# Patient Record
Sex: Female | Born: 1960 | Race: Black or African American | Hispanic: No | Smoking: Former smoker
Health system: Southern US, Community
[De-identification: ages and names within clinical notes are randomized; demographics above are authoritative.]

## PROBLEM LIST (undated history)

## (undated) DIAGNOSIS — I10 Essential (primary) hypertension: Secondary | ICD-10-CM

## (undated) DIAGNOSIS — K219 Gastro-esophageal reflux disease without esophagitis: Secondary | ICD-10-CM

## (undated) DIAGNOSIS — G4700 Insomnia, unspecified: Secondary | ICD-10-CM

## (undated) DIAGNOSIS — C73 Malignant neoplasm of thyroid gland: Secondary | ICD-10-CM

## (undated) DIAGNOSIS — J45909 Unspecified asthma, uncomplicated: Secondary | ICD-10-CM

## (undated) DIAGNOSIS — F419 Anxiety disorder, unspecified: Secondary | ICD-10-CM

## (undated) DIAGNOSIS — M1990 Unspecified osteoarthritis, unspecified site: Secondary | ICD-10-CM

## (undated) DIAGNOSIS — T7840XA Allergy, unspecified, initial encounter: Secondary | ICD-10-CM

## (undated) DIAGNOSIS — F32A Depression, unspecified: Secondary | ICD-10-CM

## (undated) DIAGNOSIS — M222X1 Patellofemoral disorders, right knee: Secondary | ICD-10-CM

## (undated) DIAGNOSIS — E559 Vitamin D deficiency, unspecified: Secondary | ICD-10-CM

## (undated) DIAGNOSIS — E039 Hypothyroidism, unspecified: Secondary | ICD-10-CM

## (undated) DIAGNOSIS — F329 Major depressive disorder, single episode, unspecified: Secondary | ICD-10-CM

## (undated) HISTORY — DX: Patellofemoral disorders, right knee: M22.2X1

## (undated) HISTORY — DX: Essential (primary) hypertension: I10

## (undated) HISTORY — DX: Unspecified osteoarthritis, unspecified site: M19.90

## (undated) HISTORY — DX: Malignant neoplasm of thyroid gland: C73

## (undated) HISTORY — DX: Hypothyroidism, unspecified: E03.9

## (undated) HISTORY — DX: Anxiety disorder, unspecified: F41.9

## (undated) HISTORY — DX: Depression, unspecified: F32.A

## (undated) HISTORY — DX: Major depressive disorder, single episode, unspecified: F32.9

## (undated) HISTORY — DX: Vitamin D deficiency, unspecified: E55.9

## (undated) HISTORY — DX: Allergy, unspecified, initial encounter: T78.40XA

## (undated) HISTORY — DX: Insomnia, unspecified: G47.00

## (undated) HISTORY — DX: Gastro-esophageal reflux disease without esophagitis: K21.9

## (undated) HISTORY — PX: ABDOMINAL HYSTERECTOMY: SHX81

---

## 1999-02-26 ENCOUNTER — Other Ambulatory Visit: Admission: RE | Admit: 1999-02-26 | Discharge: 1999-02-26 | Payer: Self-pay | Admitting: Obstetrics and Gynecology

## 1999-07-23 ENCOUNTER — Encounter: Payer: Self-pay | Admitting: Emergency Medicine

## 1999-07-23 ENCOUNTER — Emergency Department (HOSPITAL_COMMUNITY): Admission: EM | Admit: 1999-07-23 | Discharge: 1999-07-23 | Payer: Self-pay | Admitting: Emergency Medicine

## 1999-09-11 ENCOUNTER — Emergency Department (HOSPITAL_COMMUNITY): Admission: EM | Admit: 1999-09-11 | Discharge: 1999-09-11 | Payer: Self-pay | Admitting: Emergency Medicine

## 2000-03-03 ENCOUNTER — Other Ambulatory Visit: Admission: RE | Admit: 2000-03-03 | Discharge: 2000-03-03 | Payer: Self-pay | Admitting: *Deleted

## 2001-03-05 ENCOUNTER — Other Ambulatory Visit: Admission: RE | Admit: 2001-03-05 | Discharge: 2001-03-05 | Payer: Self-pay | Admitting: *Deleted

## 2002-09-10 ENCOUNTER — Other Ambulatory Visit: Admission: RE | Admit: 2002-09-10 | Discharge: 2002-09-10 | Payer: Self-pay | Admitting: Obstetrics and Gynecology

## 2002-12-04 ENCOUNTER — Encounter (INDEPENDENT_AMBULATORY_CARE_PROVIDER_SITE_OTHER): Payer: Self-pay

## 2002-12-04 ENCOUNTER — Ambulatory Visit (HOSPITAL_COMMUNITY): Admission: RE | Admit: 2002-12-04 | Discharge: 2002-12-04 | Payer: Self-pay | Admitting: Obstetrics and Gynecology

## 2002-12-15 ENCOUNTER — Encounter: Payer: Self-pay | Admitting: Emergency Medicine

## 2002-12-15 ENCOUNTER — Emergency Department (HOSPITAL_COMMUNITY): Admission: EM | Admit: 2002-12-15 | Discharge: 2002-12-15 | Payer: Self-pay | Admitting: Emergency Medicine

## 2003-01-18 ENCOUNTER — Emergency Department (HOSPITAL_COMMUNITY): Admission: EM | Admit: 2003-01-18 | Discharge: 2003-01-18 | Payer: Self-pay | Admitting: Emergency Medicine

## 2005-08-23 ENCOUNTER — Ambulatory Visit (HOSPITAL_COMMUNITY): Admission: RE | Admit: 2005-08-23 | Discharge: 2005-08-23 | Payer: Self-pay | Admitting: Obstetrics and Gynecology

## 2005-09-19 ENCOUNTER — Ambulatory Visit (HOSPITAL_COMMUNITY): Admission: RE | Admit: 2005-09-19 | Discharge: 2005-09-19 | Payer: Self-pay | Admitting: Obstetrics and Gynecology

## 2007-06-17 ENCOUNTER — Emergency Department (HOSPITAL_COMMUNITY): Admission: EM | Admit: 2007-06-17 | Discharge: 2007-06-17 | Payer: Self-pay | Admitting: Emergency Medicine

## 2008-03-11 ENCOUNTER — Ambulatory Visit (HOSPITAL_COMMUNITY): Admission: RE | Admit: 2008-03-11 | Discharge: 2008-03-11 | Payer: Self-pay | Admitting: Internal Medicine

## 2009-09-08 ENCOUNTER — Encounter (INDEPENDENT_AMBULATORY_CARE_PROVIDER_SITE_OTHER): Payer: Self-pay | Admitting: *Deleted

## 2009-09-14 ENCOUNTER — Ambulatory Visit (HOSPITAL_COMMUNITY): Admission: RE | Admit: 2009-09-14 | Discharge: 2009-09-14 | Payer: Self-pay | Admitting: Internal Medicine

## 2009-09-22 ENCOUNTER — Encounter: Payer: Self-pay | Admitting: Gastroenterology

## 2009-10-13 ENCOUNTER — Encounter (INDEPENDENT_AMBULATORY_CARE_PROVIDER_SITE_OTHER): Payer: Self-pay | Admitting: Obstetrics and Gynecology

## 2009-10-13 ENCOUNTER — Inpatient Hospital Stay (HOSPITAL_COMMUNITY): Admission: RE | Admit: 2009-10-13 | Discharge: 2009-10-15 | Payer: Self-pay | Admitting: Obstetrics and Gynecology

## 2009-11-12 ENCOUNTER — Encounter: Admission: RE | Admit: 2009-11-12 | Discharge: 2009-11-12 | Payer: Self-pay | Admitting: Family Medicine

## 2010-01-02 ENCOUNTER — Emergency Department (HOSPITAL_COMMUNITY): Admission: EM | Admit: 2010-01-02 | Discharge: 2010-01-02 | Payer: Self-pay | Admitting: Emergency Medicine

## 2010-02-15 ENCOUNTER — Encounter: Admission: RE | Admit: 2010-02-15 | Discharge: 2010-02-15 | Payer: Self-pay | Admitting: Family Medicine

## 2010-09-05 HISTORY — PX: THYROIDECTOMY: SHX17

## 2010-09-22 LAB — BASIC METABOLIC PANEL
BUN: 7 mg/dL (ref 6–23)
CO2: 26 mEq/L (ref 19–32)
Calcium: 9.5 mg/dL (ref 8.4–10.5)
Chloride: 103 mEq/L (ref 96–112)
Creatinine, Ser: 0.92 mg/dL (ref 0.4–1.2)
GFR calc Af Amer: >60 mL/min (ref >60)
GFR calc non Af Amer: >60 mL/min (ref >60)
Glucose, Bld: 96 mg/dL (ref 70–99)
Potassium: 3.6 mEq/L (ref 3.5–5.1)
Sodium: 139 mEq/L (ref 135–145)

## 2010-09-22 LAB — CBC
HCT: 39.3 % (ref 36.0–46.0)
Hemoglobin: 13.1 g/dL (ref 12.0–15.0)
MCH: 28.1 pg (ref 26.0–34.0)
MCHC: 33.3 g/dL (ref 30.0–36.0)
MCV: 84.2 fL (ref 78.0–100.0)
Platelets: 277 10*3/uL (ref 150–400)
RBC: 4.67 MIL/uL (ref 3.87–5.11)
RDW: 13.3 % (ref 11.5–15.5)
WBC: 3.9 10*3/uL — ABNORMAL LOW (ref 4.0–10.5)

## 2010-09-22 LAB — SURGICAL PCR SCREEN
MRSA, PCR: NEGATIVE
Staphylococcus aureus: NEGATIVE

## 2010-09-23 ENCOUNTER — Encounter (INDEPENDENT_AMBULATORY_CARE_PROVIDER_SITE_OTHER): Payer: Self-pay | Admitting: Surgery

## 2010-09-23 ENCOUNTER — Ambulatory Visit (HOSPITAL_COMMUNITY)
Admission: RE | Admit: 2010-09-23 | Discharge: 2010-09-24 | Payer: Self-pay | Source: Home / Self Care | Attending: Surgery | Admitting: Surgery

## 2010-10-04 NOTE — Op Note (Signed)
[name], Ferguson                ACCOUNT NO.:  [id number]  MEDICAL RECORD NO.:  [id number]          PATIENT TYPE:  OIB  LOCATION:  5016                         FACILITY:  [facility]  PHYSICIAN:  Ardeth Sportsman, MD     DATE OF BIRTH:  12/20/1960  DATE OF PROCEDURE:  09/23/2010 DATE OF DISCHARGE:                              OPERATIVE REPORT   PRIMARY CARE PHYSICIAN:  Dr. Clarene Reamer, [facility].  OPERATING SURGEON:  Ardeth Sportsman, MD  ASSISTANT:  Juanetta Gosling, MD  PREOPERATIVE DIAGNOSES:  Thyroid nodule and right isthmus, question of follicular neoplasm.  POSTOPERATIVE DIAGNOSES:  Thyroid nodule and right isthmus, question of follicular neoplasm.  PROCEDURE PERFORMED:  Right thyroid lobectomy and isthmusectomy with medial left thyroid lobectomy.  She had central compartment neck lymph node dissection.  ANESTHESIA: 1. General anesthesia. 2. Local anesthetic and a field block around the incision.  SPECIMENS:  Right thyroid gland with isthmus and medial cuff of left thyroid lobe, there are stitches in the right superior pole.  DRAINS:  None.  ESTIMATED BLOOD LOSS:  Less than 20 mL.  COMPLICATIONS:  None apparent.  INDICATIONS:  Ms. Kimberly Ferguson is a 50 year old female with a family history of thyroid problems including thyroid mass and 2 aunts that she recalls been told she had thyroid cancer.  She felt fullness in her throat about a year ago and it has persisted and felt a mass that seemed to increase.  She had an ultrasound that showed a mass in the junction between the isthmus and right thyroid lobe.  Biopsy was suspicious for follicular neoplasm.  The anatomy and physiology of thyroid and parathyroid gland was discussed.  Differential diagnosis discussed.  Options discussed. Recommendation was made for lobectomy with isthmusectomy.  Possibility of completion thyroidectomy needed later was discussed.  Risks of  nerve injury, bleeding, re-operation, difficulty swallowing, hoarseness, and other risks were discussed.  Questions were answered and she agreed to proceed.  OPERATIVE FINDINGS:  She had a smooth, firm cervical nodule in the right isthmus.  The main thyroid lobes were smooth and not particularly abnormal.  She did not have any obvious lymphadenopathy.  DESCRIPTION OF PROCEDURE:  Informed consent was confirmed.  The patient underwent general anesthesia without any difficulty.  She received IV clindamycin and prior to surgery.  She had sequential compression devices active during the entire case.  She was positioned in a beach- chair positioning with her arms tucked and her chest mildly elevated. She had very mild extension to her neck under anesthesia guidance.  Her neck and chest were prepped and draped in a sterile fashion.  Surgical time-out confirmed our plan.  I made a 5-cm transverse curvilinear incision within the natural neck skin folds just about a finger breadth below the thyroid cartilage.  I went through subcutaneous tissues and the platysma.  It was over a centimeter thick, so I extended the incision to about 7 cm total.  I was able to split the platysma transversely.  I used blunt and cautery dissection to create subplatysmal planes down towards the manubrium inferiorly and  above the thyroid cartilage superiorly.  I placed an ulnar retractor.  I did encounter a small bleeding on one of the anterior jugular veins was easily able to control with ultrasonic dissection with the harmonic scalpel.  I split the strap muscles vertically to the midline and I found the isthmus.  I could easily feel the nodule.  It was firm, but smooth, small, and not fixed to any contagious structures.  I proceeded, could not feel and palpate any other obvious major abnormalities in her thyroid lobe.  I proceeded with right thyroid lobectomy since the mass seemed to be at the junction between the  isthmus and the right thyroid gland.  We reflected the strap muscles off the thyroid gland primarily bluntly as well as using some focus cautery laterally until we came posteriorly and then we transitioned over to clips and harmonic and avoided any cauterization on the lateral and posterior walls of the thyroid.  I mobilized until I found the middle of thyroid vein, elevated, isolated, and then ligated with clips on the neck side and ultrasonic dissection on the thyroid side.  We actually dissected over and easily isolated the superior thyroid lobe.  I carefully freed off the vessels to the superior thyroid lobe.  A strong candidate for the  superior parathyroid gland was isolated and preserved and peeled off the thyroid gland and left intact.  With that I came around the apex posteriorly.  I freed the medial and superior pole off the thyroid cartilage as well being carefully meticulous.  However, at the ligament of Allyson Sabal it was still rather firm.  Therefore, I transitioned over to the right inferior thyroid pole. Actually I freed off the inferior thyroid vessels and a good candidate for the inferior parathyroid gland and freed them off as well.  With that I got excellent lobulization localization of the  inferior gland and the medial gland until I came just around the ligament of Berry.  I could see the laryngeal nerve coming in transversely and then we stayed away from that.  It gradually freed off from thyroid gland off its attachments to the ligament of Berry and thyroid cartilage between clips and harmonic scalpel.  Eventually with freeing that off,  I could easily free it off the trachea.  I transitioned once I came to the anterior midline trachea, I switched over to cautery to free off the isthmus completely off and then bluntly free off the medial left thyroid lobe off as well.  Now I could put retraction and again palpate the left thyroid lobe and felt normal.  I did focus  on central lymph node dissection and free it off numerous fat pads and lymph nodes from above the thyroid gland down to the manubrium.  I freed that off primarily using ultrasonic dissection as well as some focus cautery.  Given the fact that the mass was in the right medial isthmus, I decided to take a cuff of left thyroid lobe as well to make sure I had at least 1 cm margin around the mass.  Therefore, I used a Kelly clamp and clamped on the mid left thyroid gland well away from the major structures laterally and posteriorly.  I took a healthy cuff of medial left thyroid lobe with that.  I suture ligated the exposed thyroid stump with 2-0 silk stitch.  I marked the specimen.  I ensured hemostasis.  There were 1 or 2 tiny venous oozing vessels along the tracheoesophageal groove.  I was  able to isolate those away and those were controlled with clips.  I then did copious pressure for about 5 minutes.  Hemostasis was excellent.  I did copious irrigation and hemostasis was good.  I placed 1 x 1 cm stamps of Surgicel in the wound.  I approximated the strap muscles using 2-0 Vicryl stitches vertically keeping 1-cm gap from the inferior aspect.  I closed the platysma transversely using 3-0 Vicryl interrupted deep dermal stitches and platysmal stitches.  I closed the skin using running 4-0 Prolene stitches with Steri-Strips as well.  Sterile dressing was applied.  The patient was extubated and sent to recovery room in stable condition. Counts were correct.  We maintained latex precautions.  I have discussed postoperative care with the patient in detail in the office just prior to surgery.  I have discussed with the family as well.     Ardeth Sportsman, MD     SCG/MEDQ  D:  09/23/2010  T:  09/24/2010  Job:  [id number]  cc:   Clarene Reamer, MD  Electronically Signed by Karie Soda MD on 10/04/2010 12:29:21 PM

## 2010-10-05 NOTE — Letter (Signed)
Summary: Pre Visit No Show Letter  [facility]  [address] [place], [place] [address]   Phone: [phone]  Fax: [phone]        September 22, 2009 MRN: [id number]    [facility] [address] [place], [place]  [address]    Dear Kimberly Ferguson,   We have been unable to reach you by phone concerning the pre-procedure visit that you missed on 09/22/2009. For this reason,your procedure scheduled on 10/12/2009 has been cancelled. Our scheduling staff will gladly assist you with rescheduling your appointments at a more convenient time. Please call our office at [phone] between the hours of 8:00am and 5:00pm, press option #2 to reach an appointment scheduler. Please consider updating your contact numbers at this time so that we can reach you by phone in the future with schedule changes or results.    Thank you,    Wyona Almas RN [facility]

## 2010-10-05 NOTE — Letter (Signed)
Summary: Previsit letter  [facility]  [address] [place], [place] [address]   Phone: [phone]  Fax: [phone]       09/08/2009 MRN: [id number]  Kimberly Ferguson [address] [place], [place]  [address]  Dear [name],  Welcome to the Gastroenterology Division at [facility].    You are scheduled to see a nurse for your pre-procedure visit on 1.18-11 at 10:00a.m. on the 3rd floor at [facility], [address]  We ask that you try to arrive at our office 15 minutes prior to your appointment time to allow for check-in.  Your nurse visit will consist of discussing your medical and surgical history, your immediate family medical history, and your medications.    Please bring a complete list of all your medications or, if you prefer, bring the medication bottles and we will list them.  We will need to be aware of both prescribed and over the counter drugs.  We will need to know exact dosage information as well.  If you are on blood thinners (Coumadin, Plavix, Aggrenox, Ticlid, etc.) please call our office today/prior to your appointment, as we need to consult with your physician about holding your medication.   Please be prepared to read and sign documents such as consent forms, a financial agreement, and acknowledgement forms.  If necessary, and with your consent, a friend or relative is welcome to sit-in on the nurse visit with you.  Please bring your insurance card so that we may make a copy of it.  If your insurance requires a referral to see a specialist, please bring your referral form from your primary care physician.  No co-pay is required for this nurse visit.     If you cannot keep your appointment, please call [phone] to cancel or reschedule prior to your appointment date.  This allows [place] the opportunity to schedule an appointment for another patient in need of care.    Thank you for choosing [facility] for your  medical needs.  We appreciate the opportunity to care for you.  Please visit [place] at our website  to learn more about our practice.                     Sincerely.                                                                                                                   The Gastroenterology Division

## 2010-10-07 ENCOUNTER — Ambulatory Visit (HOSPITAL_COMMUNITY)
Admission: RE | Admit: 2010-10-07 | Discharge: 2010-10-08 | Disposition: A | Discharge status: Home / Self Care | Payer: 59 | Attending: Surgery | Admitting: Surgery

## 2010-10-07 ENCOUNTER — Other Ambulatory Visit: Payer: Self-pay | Admitting: Surgery

## 2010-10-07 DIAGNOSIS — I10 Essential (primary) hypertension: Secondary | ICD-10-CM | POA: Insufficient documentation

## 2010-10-07 DIAGNOSIS — C73 Malignant neoplasm of thyroid gland: Secondary | ICD-10-CM | POA: Insufficient documentation

## 2010-10-07 DIAGNOSIS — E669 Obesity, unspecified: Secondary | ICD-10-CM | POA: Insufficient documentation

## 2010-10-07 LAB — CALCIUM: Calcium: 8.9 mg/dL (ref 8.4–10.5)

## 2010-10-07 LAB — MRSA PCR SCREENING: MRSA by PCR: NEGATIVE

## 2010-10-08 LAB — CALCIUM: Calcium: 9.2 mg/dL (ref 8.4–10.5)

## 2010-10-12 NOTE — Op Note (Signed)
NAMEJOYCELYN, Kimberly Ferguson                ACCOUNT NO.:  [id number]  MEDICAL RECORD NO.:  [id number]           PATIENT TYPE:  O  LOCATION:  1540                         FACILITY:  [facility]  PHYSICIAN:  Ardeth Sportsman, MD     DATE OF BIRTH:  11-21-1960  DATE OF PROCEDURE:  10/07/2010 DATE OF DISCHARGE:  10/08/2010                              OPERATIVE REPORT   PRIMARY CARE PHYSICIAN:  Tracey Harries, M.D.  OPERATING SURGEON:  Ardeth Sportsman, M.D.  ASSISTANT:  Wilmon Arms. Tsuei, M.D.  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  PROCEDURE PERFORMED:  Completion thyroidectomy.  ANESTHESIA: 1. General anesthesia. 2. Local anesthetic and a field block around the incision.  SPECIMENS: 1. Left thyroid gland. 2. The long Vicryl stitches at the superior pole. 3. The old short soaked stitches are at the isthmus.  ESTIMATED BLOOD LOSS:  Minimal.  DRAINS:  None.  COMPLICATIONS:  None apparent.  INDICATIONS:  Ms. Orrick is a 50 year old obese female with a family history of thyroid nodules and thyroid cancer who was found to have an abnormal mass.  Biopsy was suspicious for possibly a neoplasm.  She underwent a right thyroid lobectomy and isthmusectomy for diagnostic and possible therapeutic purposes.  Pathology came back consistent with cancer.  I discussed this with the patient and numerous colleagues and recommended completion thyroidectomy.  Anatomy and physiology of the thyroid and parathyroid gland and the neck anatomy was discussed. Pathophysiology of thyroid cancer was discussed.  The risks, benefits and alternatives were discussed.  Questions were answered and she agreed to proceed.  OPERATIVE FINDINGS:  She has some moderate expected postoperative adhesions for postoperative day number 15.  I did not detect any significant  lymphadenopathy or other abnormalities of the remaining gland.  I did not explore the right side.  DESCRIPTION OF PROCEDURE:  Informed consent was confirmed.  The patient received IV clindamycin, given her allergy to penicillin-based antibiotics.  She underwent general anesthesia without any problems. She was placed supine with arms tucked.  She had sequential compression devices active.  Her head and neck were prepped and draped in the sterile fashion.  A surgical time-out confirmed the plan.  I went ahead and removed the old blue Prolene stitch.  I excised the prior transverse scar, as it was mildly elevated.  I got through the subcutaneous tissues and through the subplatysmal plane taking out the old Vicryl stitches.  I mobilized the subplatysmal flaps, primarily bluntly as well as a couple of sharp areas.  I placed the Mahorner retractor in.  I split the strap muscles longitudinally through the prior Vicryl closure and between them.  As expected, she had some dense adhesion of the strap muscles to the left trachea.  I carefully freed the strap muscles off until I could get to the anterolateral surface of the left thyroid lobe.  I carefully mobilized it off.  Using focused sharp, blunt, and harmonic dissection; I freed the strap muscles off the left thyroid  lobe as I moved more laterally.  I avoided any cauterization during the rest of the thyroid dissection.  I focused attention towards the superior thyroid pole.  I followed the thyroid gland cephalad. I skeletonized the superior thyroid vessels and ligated them with clips on the neck side and harmonic scalpel on the thyroid side.  Coming from anterior to posterior superior pole, I saw a good candidate for the superior parathyroid gland and we freed that off the posterior side of the thyroid and continued down more posteriorly.  I did free the superior pole off its attachments to the larynx and the trachea primarily bluntly as well  as some focal ultrasonic dissection as well.  Next I began to mobilize down towards the middle thyroid.  I could mainly bluntly free off.  She had a small middle vein that I could easily control with a clip and ultrasonic dissection.    We came around more inferiorly.  The inferior pole had a little inflammation and scarring, but eventually between alternating from the trachea side and the lateral side I could elevate the inferior pole off the trachea and off the inferior structures.  I found it came around anteriorly, again using clips and the harmonic scalpel.  I eventually could roll off the thyroid gland off the trachea.  I saw a good candidate for the inferior parathyroid gland that was hugging close to the inferior part of the thyroid pole and kept that as posterior as possible.  As I came back I could see a wispy nerve near the trachesophageal groove and kept that posterior, away from dissection. As I rolled the thyroid gland up and came up to the anterior trachea, I elevated from the inferior pole towards the superior pole using ultrasonic dissection off the trachea until I finally had the ligament of Berry pedicle and took that with ultrasonic dissection as well after confirming the location of the gland.  I removed the gland and labeled it.  I did copious irrigation of the wound.  Hemostasis was excellent.  I placed postage stamp-sized Surgicel in the base of the wound.  I reapproximated the wound using 3-0 Vicryl interrupted stitches to reapproximate the strap muscles to the midline. I reapproximated the platysma transversely as before and then closed the skin using running 4-0 Monocryl stitches and Steri-Strips.  The patient is now being extubated and taken to the recovery room in stable condition.  We will follow the patient at least overnight, calcium levels and follow up on final pathology and go from there.  I anticipate having Endocrine consultation to see if she  would benefit from any radioactive iodine therapy or further evaluation.     Ardeth Sportsman, MD     SCG/MEDQ  D:  10/07/2010  T:  10/08/2010  Job:  [id number]  cc:   Tracey Harries, M.D. Fax: [phone]  Electronically Signed by Karie Soda MD on 10/12/2010 09:57:56 AM

## 2010-11-24 LAB — COMPREHENSIVE METABOLIC PANEL
ALT: 17 U/L (ref 0–35)
AST: 25 U/L (ref 0–37)
Albumin: 4 g/dL (ref 3.5–5.2)
Alkaline Phosphatase: 64 U/L (ref 39–117)
BUN: 8 mg/dL (ref 6–23)
CO2: 27 mEq/L (ref 19–32)
Calcium: 9.6 mg/dL (ref 8.4–10.5)
Chloride: 104 mEq/L (ref 96–112)
Creatinine, Ser: 0.95 mg/dL (ref 0.4–1.2)
GFR calc Af Amer: >60 mL/min (ref >60)
GFR calc non Af Amer: >60 mL/min (ref >60)
Glucose, Bld: 89 mg/dL (ref 70–99)
Potassium: 3.4 mEq/L — ABNORMAL LOW (ref 3.5–5.1)
Sodium: 137 mEq/L (ref 135–145)
Total Bilirubin: 0.8 mg/dL (ref 0.3–1.2)
Total Protein: 7.6 g/dL (ref 6.0–8.3)

## 2010-11-24 LAB — CBC
HCT: 31.9 % — ABNORMAL LOW (ref 36.0–46.0)
HCT: 38 % (ref 36.0–46.0)
Hemoglobin: 10.3 g/dL — ABNORMAL LOW (ref 12.0–15.0)
Hemoglobin: 12.1 g/dL (ref 12.0–15.0)
MCHC: 31.8 g/dL (ref 30.0–36.0)
MCHC: 32.2 g/dL (ref 30.0–36.0)
MCV: 81.5 fL (ref 78.0–100.0)
MCV: 82.3 fL (ref 78.0–100.0)
Platelets: 230 10*3/uL (ref 150–400)
Platelets: 280 10*3/uL (ref 150–400)
RBC: 3.87 MIL/uL (ref 3.87–5.11)
RBC: 4.66 MIL/uL (ref 3.87–5.11)
RDW: 16.5 % — ABNORMAL HIGH (ref 11.5–15.5)
RDW: 17 % — ABNORMAL HIGH (ref 11.5–15.5)
WBC: 12.8 10*3/uL — ABNORMAL HIGH (ref 4.0–10.5)
WBC: 4.2 10*3/uL (ref 4.0–10.5)

## 2010-11-24 LAB — PREGNANCY, URINE: Preg Test, Ur: NEGATIVE

## 2010-11-25 ENCOUNTER — Emergency Department (HOSPITAL_COMMUNITY)
Admission: EM | Admit: 2010-11-25 | Discharge: 2010-11-25 | Disposition: A | Discharge status: Home / Self Care | Payer: 59 | Attending: Emergency Medicine | Admitting: Emergency Medicine

## 2010-11-25 DIAGNOSIS — S335XXA Sprain of ligaments of lumbar spine, initial encounter: Secondary | ICD-10-CM | POA: Insufficient documentation

## 2010-11-25 DIAGNOSIS — K219 Gastro-esophageal reflux disease without esophagitis: Secondary | ICD-10-CM | POA: Insufficient documentation

## 2010-11-25 DIAGNOSIS — M545 Low back pain, unspecified: Secondary | ICD-10-CM | POA: Insufficient documentation

## 2010-11-25 DIAGNOSIS — I10 Essential (primary) hypertension: Secondary | ICD-10-CM | POA: Insufficient documentation

## 2010-11-25 DIAGNOSIS — X500XXA Overexertion from strenuous movement or load, initial encounter: Secondary | ICD-10-CM | POA: Insufficient documentation

## 2010-11-25 DIAGNOSIS — Y92009 Unspecified place in unspecified non-institutional (private) residence as the place of occurrence of the external cause: Secondary | ICD-10-CM | POA: Insufficient documentation

## 2010-11-25 LAB — URINALYSIS, ROUTINE W REFLEX MICROSCOPIC
Bilirubin Urine: NEGATIVE
Glucose, UA: NEGATIVE mg/dL
Hgb urine dipstick: NEGATIVE
Ketones, ur: NEGATIVE mg/dL
Nitrite: NEGATIVE
Protein, ur: NEGATIVE mg/dL
Specific Gravity, Urine: 1.024 (ref 1.005–1.030)
Urobilinogen, UA: 0.2 mg/dL (ref 0.0–1.0)
pH: 5.5 (ref 5.0–8.0)

## 2010-11-26 ENCOUNTER — Other Ambulatory Visit (HOSPITAL_COMMUNITY): Payer: Self-pay | Admitting: Internal Medicine

## 2010-11-26 DIAGNOSIS — C73 Malignant neoplasm of thyroid gland: Secondary | ICD-10-CM

## 2010-12-09 ENCOUNTER — Other Ambulatory Visit (HOSPITAL_COMMUNITY): Payer: Self-pay | Admitting: Internal Medicine

## 2010-12-09 DIAGNOSIS — C73 Malignant neoplasm of thyroid gland: Secondary | ICD-10-CM

## 2010-12-28 ENCOUNTER — Ambulatory Visit (HOSPITAL_COMMUNITY): Admission: RE | Admit: 2010-12-28 | Payer: 59 | Source: Ambulatory Visit

## 2010-12-28 ENCOUNTER — Encounter (HOSPITAL_COMMUNITY)
Admission: RE | Admit: 2010-12-28 | Discharge: 2010-12-28 | Disposition: A | Discharge status: Home / Self Care | Payer: 59 | Source: Ambulatory Visit | Attending: Internal Medicine | Admitting: Internal Medicine

## 2010-12-28 DIAGNOSIS — C73 Malignant neoplasm of thyroid gland: Secondary | ICD-10-CM | POA: Insufficient documentation

## 2010-12-29 ENCOUNTER — Encounter (HOSPITAL_COMMUNITY)
Admission: RE | Admit: 2010-12-29 | Discharge: 2010-12-29 | Disposition: A | Discharge status: Home / Self Care | Payer: 59 | Source: Ambulatory Visit | Attending: Internal Medicine | Admitting: Internal Medicine

## 2010-12-30 ENCOUNTER — Ambulatory Visit (HOSPITAL_COMMUNITY): Payer: 59

## 2010-12-30 ENCOUNTER — Encounter (HOSPITAL_COMMUNITY)
Admission: RE | Admit: 2010-12-30 | Discharge: 2010-12-30 | Disposition: A | Discharge status: Home / Self Care | Payer: 59 | Source: Ambulatory Visit | Attending: Internal Medicine | Admitting: Internal Medicine

## 2010-12-30 DIAGNOSIS — C73 Malignant neoplasm of thyroid gland: Secondary | ICD-10-CM | POA: Insufficient documentation

## 2010-12-30 MED ORDER — SODIUM IODIDE I 131 CAPSULE
108.9000 | Freq: Once | INTRAVENOUS | Status: AC | PRN
  Administered 2010-12-30: 108.9 via ORAL

## 2011-01-07 ENCOUNTER — Ambulatory Visit (HOSPITAL_COMMUNITY)
Admission: RE | Admit: 2011-01-07 | Discharge: 2011-01-07 | Disposition: A | Discharge status: Home / Self Care | Payer: 59 | Source: Ambulatory Visit | Attending: Internal Medicine | Admitting: Internal Medicine

## 2011-01-07 DIAGNOSIS — C73 Malignant neoplasm of thyroid gland: Secondary | ICD-10-CM

## 2011-01-21 NOTE — Op Note (Signed)
[name], Kimberly Ferguson                            ACCOUNT NO.:  [id number]   MEDICAL RECORD NO.:  [id number]                   PATIENT TYPE:  AMB   LOCATION:  SDC                                  FACILITY:  [facility]   PHYSICIAN:  Sherry A. Rosalio Macadamia, M.D.           DATE OF BIRTH:  [birth year]   DATE OF PROCEDURE:  12/04/2002  DATE OF DISCHARGE:                                 OPERATIVE REPORT   PREOPERATIVE DIAGNOSES:  1. Menorrhagia.  2. Fibroid uterus.   POSTOPERATIVE DIAGNOSES:  1. Menorrhagia.  2. Fibroid uterus.   PROCEDURE:  Dilatation and curettage, hysteroscopy with resectoscope.   SURGEON:  Sherry A. Rosalio Macadamia, M.D.  .   ANESTHESIA:  MAC.   INDICATIONS:  This is a 50 year old G1, P1-0-0-1 woman, who has excessively  heavy periods with large blood clots.  The patient changes an overnight pad  every 1-1/2 hours.  The patient had ultrasound performed in the office,  which revealed multiple fibroids.  Sonohysterogram was performed, which  showed an anterior fibroid approximately 1.6 x 1.3 cm, which appeared to be  submucosal.  Because of this, the patient is brought into the operating room  for [facility], hysteroscopy with resectoscope.   FINDINGS:  A 10-week size anteflexed uterus, no adnexal mass.   DESCRIPTION OF PROCEDURE:  The patient was brought into the operating room  and given adequate IV sedation.  She was placed in the dorsal lithotomy  position.  Her perineum and vagina were washed with Betadine.  The bladder  was in-and-out catheterized.  The pelvic exam was performed.  The patient  was draped in a sterile fashion, speculum was placed within the vagina.  The  vagina was washed with Betadine.  A paracervical block was administered with  1% Nesacaine.  The anterior lip of the cervix was grasped with a single-  tooth tenaculum.  The cervix was sounded and dilated with Pratt dilators to  a #31.  The hysteroscope was introduced into the endometrial cavity and  pictures  were obtained.  The cavity was very capacious.  The samples of the  endometrial cavity were sampled circumferentially with a double-loop right  angle resector.  The anterior wall was obviously distorted by fibroids, but  no one distinct submucosal fibroid could be identified.  A significant  amount of the anterior fibroids were removed; however, they were not  completely removed because it was not felt to be completely submucosal and  the end of the fibroids could not be distinguished.  Therefore, once  circumferential sampling was obtained and  adequate hemostasis was obtained, all instruments were removed from the  vagina.  The patient was taken out of the dorsal lithotomy position.  She  was awakened.  She was moved from the operating table to a stretcher in  stable condition.  Complications were none.  Estimated blood loss less than  5 mL.  Sorbitol differential -90  mL.                                                Sherry A. Rosalio Macadamia, M.D.    SAD/MEDQ  D:  12/04/2002  T:  12/04/2002  Job:  [id number]

## 2011-03-14 ENCOUNTER — Other Ambulatory Visit: Payer: Self-pay | Admitting: Family Medicine

## 2011-03-14 ENCOUNTER — Other Ambulatory Visit (HOSPITAL_COMMUNITY): Payer: Self-pay | Admitting: Family Medicine

## 2011-03-14 ENCOUNTER — Encounter: Payer: Self-pay | Admitting: Gastroenterology

## 2011-03-14 DIAGNOSIS — R911 Solitary pulmonary nodule: Secondary | ICD-10-CM

## 2011-03-14 DIAGNOSIS — Z1231 Encounter for screening mammogram for malignant neoplasm of breast: Secondary | ICD-10-CM

## 2011-03-15 ENCOUNTER — Ambulatory Visit
Admission: RE | Admit: 2011-03-15 | Discharge: 2011-03-15 | Disposition: A | Discharge status: Home / Self Care | Payer: 59 | Source: Ambulatory Visit | Attending: Family Medicine | Admitting: Family Medicine

## 2011-03-15 DIAGNOSIS — R911 Solitary pulmonary nodule: Secondary | ICD-10-CM

## 2011-03-18 ENCOUNTER — Other Ambulatory Visit: Payer: 59

## 2011-03-22 ENCOUNTER — Ambulatory Visit (HOSPITAL_COMMUNITY): Payer: 59

## 2011-03-23 ENCOUNTER — Other Ambulatory Visit: Payer: Self-pay | Admitting: Family Medicine

## 2011-03-23 ENCOUNTER — Other Ambulatory Visit: Payer: Self-pay | Admitting: Internal Medicine

## 2011-03-23 DIAGNOSIS — Z1231 Encounter for screening mammogram for malignant neoplasm of breast: Secondary | ICD-10-CM

## 2011-03-23 DIAGNOSIS — C73 Malignant neoplasm of thyroid gland: Secondary | ICD-10-CM

## 2011-03-28 ENCOUNTER — Ambulatory Visit
Admission: RE | Admit: 2011-03-28 | Discharge: 2011-03-28 | Disposition: A | Discharge status: Home / Self Care | Payer: 59 | Source: Ambulatory Visit | Attending: Family Medicine | Admitting: Family Medicine

## 2011-03-28 DIAGNOSIS — Z1231 Encounter for screening mammogram for malignant neoplasm of breast: Secondary | ICD-10-CM

## 2011-03-29 ENCOUNTER — Ambulatory Visit
Admission: RE | Admit: 2011-03-29 | Discharge: 2011-03-29 | Disposition: A | Discharge status: Home / Self Care | Payer: 59 | Source: Ambulatory Visit | Attending: Internal Medicine | Admitting: Internal Medicine

## 2011-03-29 DIAGNOSIS — C73 Malignant neoplasm of thyroid gland: Secondary | ICD-10-CM

## 2011-03-30 ENCOUNTER — Telehealth: Payer: Self-pay | Admitting: *Deleted

## 2011-03-30 NOTE — Telephone Encounter (Signed)
Pt. Was unaware of these appts. That were made by her pcp office.  She will call as [facility] her colon when she knows her work schedule.  Kimberly Ferguson

## 2011-04-13 ENCOUNTER — Other Ambulatory Visit: Payer: 59 | Admitting: Gastroenterology

## 2011-04-29 ENCOUNTER — Emergency Department (HOSPITAL_COMMUNITY)
Admission: EM | Admit: 2011-04-29 | Discharge: 2011-04-30 | Disposition: A | Discharge status: Home / Self Care | Payer: 59 | Attending: Emergency Medicine | Admitting: Emergency Medicine

## 2011-04-29 DIAGNOSIS — R109 Unspecified abdominal pain: Secondary | ICD-10-CM | POA: Insufficient documentation

## 2011-04-29 DIAGNOSIS — M545 Low back pain, unspecified: Secondary | ICD-10-CM | POA: Insufficient documentation

## 2011-04-29 DIAGNOSIS — Z85850 Personal history of malignant neoplasm of thyroid: Secondary | ICD-10-CM | POA: Insufficient documentation

## 2011-04-29 DIAGNOSIS — I10 Essential (primary) hypertension: Secondary | ICD-10-CM | POA: Insufficient documentation

## 2011-04-29 DIAGNOSIS — R300 Dysuria: Secondary | ICD-10-CM | POA: Insufficient documentation

## 2011-04-30 ENCOUNTER — Emergency Department (HOSPITAL_COMMUNITY): Payer: 59

## 2011-04-30 LAB — URINALYSIS, ROUTINE W REFLEX MICROSCOPIC
Glucose, UA: NEGATIVE mg/dL
Ketones, ur: NEGATIVE mg/dL
Leukocytes, UA: NEGATIVE
Protein, ur: NEGATIVE mg/dL

## 2011-10-06 ENCOUNTER — Other Ambulatory Visit: Payer: Self-pay | Admitting: Internal Medicine

## 2011-10-06 DIAGNOSIS — C73 Malignant neoplasm of thyroid gland: Secondary | ICD-10-CM | POA: Insufficient documentation

## 2011-10-24 ENCOUNTER — Encounter (HOSPITAL_COMMUNITY)
Admission: RE | Admit: 2011-10-24 | Discharge: 2011-10-24 | Disposition: A | Discharge status: Home / Self Care | Payer: 59 | Source: Ambulatory Visit | Attending: Internal Medicine | Admitting: Internal Medicine

## 2011-10-24 DIAGNOSIS — C73 Malignant neoplasm of thyroid gland: Secondary | ICD-10-CM | POA: Insufficient documentation

## 2011-10-24 MED ORDER — THYROTROPIN ALFA 1.1 MG IM SOLR
0.9000 mg | INTRAMUSCULAR | Status: AC
  Administered 2011-10-24: 0.9 mg via INTRAMUSCULAR
  Filled 2011-10-24: qty 0.9

## 2011-10-25 ENCOUNTER — Ambulatory Visit (HOSPITAL_COMMUNITY)
Admission: RE | Admit: 2011-10-25 | Discharge: 2011-10-25 | Disposition: A | Discharge status: Home / Self Care | Payer: 59 | Source: Ambulatory Visit | Attending: Internal Medicine | Admitting: Internal Medicine

## 2011-10-25 DIAGNOSIS — C73 Malignant neoplasm of thyroid gland: Secondary | ICD-10-CM | POA: Insufficient documentation

## 2011-10-25 MED ORDER — THYROTROPIN ALFA 1.1 MG IM SOLR
0.9000 mg | INTRAMUSCULAR | Status: AC
  Administered 2011-10-25: 0.9 mg via INTRAMUSCULAR
  Filled 2011-10-25: qty 0.9

## 2011-10-26 ENCOUNTER — Ambulatory Visit (HOSPITAL_COMMUNITY)
Admission: RE | Admit: 2011-10-26 | Discharge: 2011-10-26 | Disposition: A | Discharge status: Home / Self Care | Payer: 59 | Source: Ambulatory Visit | Attending: Internal Medicine | Admitting: Internal Medicine

## 2011-10-26 DIAGNOSIS — C73 Malignant neoplasm of thyroid gland: Secondary | ICD-10-CM | POA: Insufficient documentation

## 2011-10-28 ENCOUNTER — Ambulatory Visit (HOSPITAL_COMMUNITY)
Admission: RE | Admit: 2011-10-28 | Discharge: 2011-10-28 | Disposition: A | Discharge status: Home / Self Care | Payer: 59 | Source: Ambulatory Visit | Attending: Internal Medicine | Admitting: Internal Medicine

## 2011-10-28 MED ORDER — SODIUM IODIDE I 131 CAPSULE
4.0000 | Freq: Once | INTRAVENOUS | Status: AC | PRN
  Administered 2011-10-28: 4 via ORAL

## 2012-01-17 ENCOUNTER — Encounter: Payer: Self-pay | Admitting: Gastroenterology

## 2012-02-08 ENCOUNTER — Ambulatory Visit (AMBULATORY_SURGERY_CENTER): Payer: 59

## 2012-02-08 ENCOUNTER — Encounter: Payer: Self-pay | Admitting: Gastroenterology

## 2012-02-08 VITALS — Ht 65.0 in | Wt 172.0 lb

## 2012-02-08 DIAGNOSIS — Z1211 Encounter for screening for malignant neoplasm of colon: Secondary | ICD-10-CM

## 2012-02-08 MED ORDER — PEG-KCL-NACL-NASULF-NA ASC-C 100 G PO SOLR: 1.0000 | Freq: Once | ORAL | Status: DC

## 2012-02-22 ENCOUNTER — Encounter: Payer: Self-pay | Admitting: Gastroenterology

## 2012-02-22 ENCOUNTER — Ambulatory Visit (AMBULATORY_SURGERY_CENTER): Payer: 59 | Admitting: Gastroenterology

## 2012-02-22 VITALS — BP 144/95 | HR 95 | Temp 97.1°F | Resp 24 | Ht 65.0 in | Wt 172.0 lb

## 2012-02-22 DIAGNOSIS — Z1211 Encounter for screening for malignant neoplasm of colon: Secondary | ICD-10-CM

## 2012-02-22 MED ORDER — SODIUM CHLORIDE 0.9 % IV SOLN: 500.0000 mL | INTRAVENOUS | Status: DC

## 2012-02-22 NOTE — Patient Instructions (Signed)
YOU HAD AN ENDOSCOPIC PROCEDURE TODAY AT THE [facility]: Refer to the procedure report that was given to you for any specific questions about what was found during the examination.  If the procedure report does not answer your questions, please call your gastroenterologist to clarify.  If you requested that your care partner not be given the details of your procedure findings, then the procedure report has been included in a sealed envelope for you to review at your convenience later.  YOU SHOULD EXPECT: Some feelings of bloating in the abdomen. Passage of more gas than usual.  Walking can help get rid of the air that was put into your GI tract during the procedure and reduce the bloating. If you had a lower endoscopy (such as a colonoscopy or flexible sigmoidoscopy) you may notice spotting of blood in your stool or on the toilet paper. If you underwent a bowel prep for your procedure, then you may not have a normal bowel movement for a few days.  DIET: Your first meal following the procedure should be a light meal and then it is ok to progress to your normal diet.  A half-sandwich or bowl of soup is an example of a good first meal.  Heavy or fried foods are harder to digest and may make you feel nauseous or bloated.  Likewise meals heavy in dairy and vegetables can cause extra gas to form and this can also increase the bloating.  Drink plenty of fluids but you should avoid alcoholic beverages for 24 hours.  ACTIVITY: Your care partner should take you home directly after the procedure.  You should plan to take it easy, moving slowly for the rest of the day.  You can resume normal activity the day after the procedure however you should NOT DRIVE or use heavy machinery for 24 hours (because of the sedation medicines used during the test).    SYMPTOMS TO REPORT IMMEDIATELY: A gastroenterologist can be reached at any hour.  During normal business hours, 8:30 AM to 5:00 PM Monday through Friday,  call [phone].  After hours and on weekends, please call the GI answering service at [phone] who will take a message and have the physician on call contact you.   Following lower endoscopy (colonoscopy or flexible sigmoidoscopy):  Excessive amounts of blood in the stool  Significant tenderness or worsening of abdominal pains  Swelling of the abdomen that is new, acute  Fever of 100F or higher    FOLLOW UP: If any biopsies were taken you will be contacted by phone or by letter within the next 1-3 weeks.  Call your gastroenterologist if you have not heard about the biopsies in 3 weeks.  Our staff will call the home number listed on your records the next business day following your procedure to check on you and address any questions or concerns that you may have at that time regarding the information given to you following your procedure. This is a courtesy call and so if there is no answer at the home number and we have not heard from you through the emergency physician on call, we will assume that you have returned to your regular daily activities without incident.  SIGNATURES/CONFIDENTIALITY: You and/or your care partner have signed paperwork which will be entered into your electronic medical record.  These signatures attest to the fact that that the information above on your After Visit Summary has been reviewed and is understood.  Full responsibility of the confidentiality   of this discharge information lies with you and/or your care-partner.     

## 2012-02-22 NOTE — Progress Notes (Signed)
Pressure applied to the abdomen to reach the cecum 

## 2012-02-22 NOTE — Op Note (Signed)
[facility] [address] [place], [place]  [address]  COLONOSCOPY PROCEDURE REPORT  PATIENT:  Kimberly Ferguson, Kimberly Ferguson  MR#:  [id number] BIRTHDATE:  05-20-1961, 51 yrs. old  GENDER:  female ENDOSCOPIST:  Barbette Hair. Arlyce Dice, MD REF. BY:  Tracey Harries, M.D. PROCEDURE DATE:  02/22/2012 PROCEDURE:  Diagnostic Colonoscopy ASA CLASS:  Class II INDICATIONS:  Routine Risk Screening MEDICATIONS:   MAC sedation, administered by CRNA propofol 200mg IV  DESCRIPTION OF PROCEDURE:   After the risks benefits and alternatives of the procedure were thoroughly explained, informed consent was obtained.  Digital rectal exam was performed and revealed no abnormalities.   The LB [id number] endoscope was introduced through the anus and advanced to the cecum, which was identified by both the appendix and ileocecal valve, without limitations.  The quality of the prep was good, using MoviPrep. The instrument was then slowly withdrawn as the colon was fully examined. <<PROCEDUREIMAGES>>  FINDINGS:  A normal appearing cecum, ileocecal valve, and appendiceal orifice were identified. The ascending, hepatic flexure, transverse, splenic flexure, descending, sigmoid colon, and rectum appeared unremarkable (see image1 and image2). Retroflexed views in the rectum revealed no abnormalities.    The time to cecum =  1) 4.50  minutes. The scope was then withdrawn in 1) 6.0  minutes from the cecum and the procedure completed. COMPLICATIONS:  None ENDOSCOPIC IMPRESSION: 1) Normal colon RECOMMENDATIONS: 1) Continue current colorectal screening recommendations for "routine risk" patients with a repeat colonoscopy in 10 years. REPEAT EXAM:  In 10 year(s) for Colonoscopy.  ______________________________ Barbette Hair. Arlyce Dice, MD  CC:  n. eSIGNED:   Barbette Hair. Sheddrick Lattanzio at 02/22/2012 10:51 AM  Karle Starch, [phone]

## 2012-02-22 NOTE — Progress Notes (Signed)
Returned to admitting area to wait for 2 hour mark after drinking fluids.

## 2012-02-22 NOTE — Progress Notes (Signed)
Patient did not experience any of the following events: a burn prior to discharge; a fall within the facility; wrong site/side/patient/procedure/implant event; or a hospital transfer or hospital admission upon discharge from the facility. (G8907) Patient did not have preoperative order for IV antibiotic SSI prophylaxis. (G8918)  

## 2012-02-22 NOTE — Progress Notes (Deleted)
Return to admitting holding area to obtain stat potassium level.

## 2012-02-23 ENCOUNTER — Telehealth: Payer: Self-pay | Admitting: *Deleted

## 2012-02-23 NOTE — Telephone Encounter (Signed)
No answer. Left message to call if questions or concerns. 

## 2012-03-10 ENCOUNTER — Encounter (HOSPITAL_COMMUNITY): Payer: Self-pay | Admitting: *Deleted

## 2012-03-10 ENCOUNTER — Emergency Department (HOSPITAL_COMMUNITY)
Admission: EM | Admit: 2012-03-10 | Discharge: 2012-03-11 | Disposition: A | Discharge status: Home / Self Care | Payer: 59 | Attending: Emergency Medicine | Admitting: Emergency Medicine

## 2012-03-10 DIAGNOSIS — M549 Dorsalgia, unspecified: Secondary | ICD-10-CM

## 2012-03-10 DIAGNOSIS — E039 Hypothyroidism, unspecified: Secondary | ICD-10-CM | POA: Insufficient documentation

## 2012-03-10 DIAGNOSIS — I10 Essential (primary) hypertension: Secondary | ICD-10-CM | POA: Insufficient documentation

## 2012-03-10 NOTE — ED Notes (Signed)
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

## 2012-03-11 MED ORDER — HYDROCODONE-ACETAMINOPHEN 5-325 MG PO TABS: 2.0000 | ORAL_TABLET | ORAL | Status: AC | PRN

## 2012-03-11 MED ORDER — DIAZEPAM 5 MG PO TABS
5.0000 mg | ORAL_TABLET | Freq: Once | ORAL | Status: AC
  Administered 2012-03-11: 5 mg via ORAL
  Filled 2012-03-11: qty 1

## 2012-03-11 MED ORDER — DIAZEPAM 5 MG PO TABS: 5.0000 mg | ORAL_TABLET | Freq: Four times a day (QID) | ORAL | Status: AC | PRN

## 2012-03-11 MED ORDER — HYDROCODONE-ACETAMINOPHEN 5-325 MG PO TABS
1.0000 | ORAL_TABLET | Freq: Once | ORAL | Status: AC
  Administered 2012-03-11: 1 via ORAL
  Filled 2012-03-11: qty 1

## 2012-03-11 NOTE — ED Provider Notes (Signed)
Medical screening examination/treatment/procedure(s) were performed by non-physician practitioner and as supervising physician I was immediately available for consultation/collaboration.    Vida Roller, MD 03/11/12 [phone]

## 2012-03-11 NOTE — ED Provider Notes (Signed)
History     CSN: [id number]  Arrival date & time 03/10/12  2249   First MD Initiated Contact with Patient 03/11/12 0051      Chief Complaint  Patient presents with  . Back Pain   HPI  History provided by the patient. Patient is a 51 year old female with history of hypertension and hypothyroidism who presents with complaints of increased low back pain. Patient reports having worsening waxing and waning low back pain for the past several months and history of back pain for many years. Patient states she has attributed this to having to carry around heavy book bag and heavy lifting while at work. Symptoms seem worse at the end of the long day. Patient usually has her husband massaged her back or use topical muscle pain reliever medications. Patient also uses over-the-counter pain medications for symptoms with some relief. Over the past few nights this is not improved symptoms. Tonight patient states she has very sharp pains that cause her to stiffen and lock up. Pain is worse lying flat and in certain positions or movements. She denies any other aggravating or alleviating factors. Pain radiates from the low back to the mid back. She denies any radiating pain down the legs. She denies any numbness or weakness in legs. She denies any urinary or fecal incontinence, urinary retention, peroneal numbness.    Past Medical History  Diagnosis Date  . Hypertension   . Hypothyroidism     Past Surgical History  Procedure Date  . Thyroidectomy 2012  . Cesarean section 1990  . Vaginal hysterectomy 2012    Family History  Problem Relation Age of Onset  . Colon cancer Maternal Grandfather     History  Substance Use Topics  . Smoking status: Never Smoker   . Smokeless tobacco: Never Used  . Alcohol Use: Yes     occasional    OB History    Grav Para Term Preterm Abortions TAB SAB Ect Mult Living                  Review of Systems  Constitutional: Negative for fever, chills, appetite  change and unexpected weight change.  HENT: Negative for neck pain.   Respiratory: Negative for shortness of breath.   Cardiovascular: Negative for chest pain.  Gastrointestinal: Negative for nausea and vomiting.  Genitourinary: Negative for dysuria, frequency, hematuria and flank pain.  Musculoskeletal: Positive for back pain.    Allergies  Latex and Penicillins  Home Medications   Current Outpatient Rx  Name Route Sig Dispense Refill  . AMLODIPINE BESYLATE 10 MG PO TABS Oral Take 10 mg by mouth daily.    Marland Kitchen HYDROCHLOROTHIAZIDE 25 MG PO TABS Oral Take 25 mg by mouth daily.    Marland Kitchen LEVOTHYROXINE SODIUM 112 MCG PO TABS Oral Take 112 mcg by mouth daily.    Marland Kitchen OMEPRAZOLE 20 MG PO CPDR Oral Take 20 mg by mouth daily.      BP 136/86  Pulse 83  Temp 98.4 F (36.9 C) (Oral)  Resp 18  SpO2 100%  Physical Exam  Nursing note and vitals reviewed. Constitutional: She is oriented to person, place, and time. She appears well-developed and well-nourished. No distress.  HENT:  Head: Normocephalic.  Cardiovascular: Normal rate and regular rhythm.   Pulmonary/Chest: Effort normal and breath sounds normal. No respiratory distress. She has no rales.  Abdominal: Soft. There is no tenderness.  Musculoskeletal:       Lumbar back: She exhibits tenderness. She exhibits no bony  tenderness.       Back:       Straight leg test was negative for radiating pain.  Neurological: She is alert and oriented to person, place, and time.  Skin: Skin is warm and dry. No rash noted. No erythema.  Psychiatric: She has a normal mood and affect. Her behavior is normal.    ED Course  Procedures    1. Back pain       MDM  Patient seen and evaluated.  I discussed with patient option for imaging tonight. Patient did have lumbar x-rays 1 year ago last August. Patient states she believed that she thought she had some x-rays more recently within the past 2 months by her PCP. We discussed options for repeat x-rays  at this time feel patient would most benefit from outpatient MRI study. At this time will treat symptoms. Patient agrees with this plan. She has no red flag symptoms concerning her low back pains.        Angus Seller, [place] 03/11/12 [phone]

## 2012-06-15 ENCOUNTER — Encounter (HOSPITAL_COMMUNITY): Payer: Self-pay | Admitting: Emergency Medicine

## 2012-06-15 ENCOUNTER — Emergency Department (HOSPITAL_COMMUNITY)
Admission: EM | Admit: 2012-06-15 | Discharge: 2012-06-15 | Disposition: A | Discharge status: Home / Self Care | Payer: 59 | Attending: Emergency Medicine | Admitting: Emergency Medicine

## 2012-06-15 ENCOUNTER — Emergency Department (HOSPITAL_COMMUNITY): Payer: 59

## 2012-06-15 DIAGNOSIS — J069 Acute upper respiratory infection, unspecified: Secondary | ICD-10-CM

## 2012-06-15 DIAGNOSIS — Z91040 Latex allergy status: Secondary | ICD-10-CM | POA: Insufficient documentation

## 2012-06-15 DIAGNOSIS — Z880 Allergy status to penicillin: Secondary | ICD-10-CM | POA: Insufficient documentation

## 2012-06-15 DIAGNOSIS — Z800 Family history of malignant neoplasm of digestive organs: Secondary | ICD-10-CM | POA: Insufficient documentation

## 2012-06-15 MED ORDER — HYDROCODONE-HOMATROPINE 5-1.5 MG/5ML PO SYRP: 5.0000 mL | ORAL_SOLUTION | Freq: Four times a day (QID) | ORAL | Status: DC | PRN

## 2012-06-15 NOTE — ED Notes (Signed)
Pt alert, arrives from home, c/o cough and congestion, onset was a week ago, states husband recently same s/s, resp even unlabored, skin pwd,

## 2012-06-15 NOTE — ED Notes (Signed)
Patient given discharge instructions, information, prescriptions, and diet order. Patient states that they adequately understand discharge information given and to return to ED if symptoms return or worsen.     

## 2012-06-15 NOTE — ED Provider Notes (Signed)
History     CSN: [id number]  Arrival date & time 06/15/12  2023   First MD Initiated Contact with Patient 06/15/12 2119      Chief Complaint  Patient presents with  . Cough    (Consider location/radiation/quality/duration/timing/severity/associated sxs/prior treatment) Patient is a 51 y.o. female presenting with cough. The history is provided by the patient.  Cough This is a new problem. Episode onset: 5 days ago. The problem occurs every few minutes. The problem has not changed since onset.The cough is non-productive. There has been no fever. Associated symptoms include rhinorrhea and sore throat. Pertinent negatives include no chest pain, no chills, no sweats, no ear congestion, no ear pain, no headaches, no myalgias, no shortness of breath, no wheezing and no eye redness. She has tried decongestants and cough syrup for the symptoms. The treatment provided mild relief. She is not a smoker. Her past medical history does not include bronchitis, pneumonia, bronchiectasis, COPD, emphysema or asthma.    Past Medical History  Diagnosis Date  . Hypertension   . Hypothyroidism     Past Surgical History  Procedure Date  . Thyroidectomy 2012  . Cesarean section 1990  . Vaginal hysterectomy 2012    Family History  Problem Relation Age of Onset  . Colon cancer Maternal Grandfather     History  Substance Use Topics  . Smoking status: Never Smoker   . Smokeless tobacco: Never Used  . Alcohol Use: Yes     occasional    OB History    Grav Para Term Preterm Abortions TAB SAB Ect Mult Living                  Review of Systems  Constitutional: Negative for chills.  HENT: Positive for sore throat and rhinorrhea. Negative for ear pain.   Eyes: Negative for redness.  Respiratory: Positive for cough. Negative for shortness of breath and wheezing.   Cardiovascular: Negative for chest pain.  Musculoskeletal: Negative for myalgias.  Neurological: Negative for headaches.  All  other systems reviewed and are negative.    Allergies  Latex and Penicillins  Home Medications   Current Outpatient Rx  Name Route Sig Dispense Refill  . AMLODIPINE BESYLATE 10 MG PO TABS Oral Take 10 mg by mouth daily.    Marland Kitchen VITAMIN D 1000 UNITS PO TABS Oral Take 1,000 Units by mouth daily.    . GUAIFENESIN 100 MG/5ML PO SYRP Oral Take 100 mg by mouth 3 (three) times daily as needed. For cough    . HYDROCHLOROTHIAZIDE 25 MG PO TABS Oral Take 25 mg by mouth daily.    Marland Kitchen LEVOTHYROXINE SODIUM 112 MCG PO TABS Oral Take 112 mcg by mouth daily.    Marland Kitchen OMEPRAZOLE 20 MG PO CPDR Oral Take 20 mg by mouth daily as needed. For acid reflux      BP 143/84  Pulse 92  Temp 99.3 F (37.4 C) (Oral)  Resp 20  Wt 179 lb 8 oz (81.421 kg)  SpO2 100%  Physical Exam  Nursing note and vitals reviewed. Constitutional: She is oriented to person, place, and time. She appears well-developed and well-nourished. No distress.  HENT:  Head: Normocephalic and atraumatic.       Uvula midline, no tonsillar exudate.  Mucous membranes moist.  No frontal or maxillary sinus pressure.  Mild rhinorrhea.  Eyes: Conjunctivae normal and EOM are normal.  Neck: Normal range of motion.  Cardiovascular:       Regular rate rhythm, no aberrancy  and auscultation  Pulmonary/Chest: Effort normal.       Lungs clear to auscultation bilaterally.  No acute respiratory distress, nasal flaring or accessory muscle use.  Musculoskeletal: Normal range of motion.  Neurological: She is alert and oriented to person, place, and time.  Skin: Skin is warm and dry. No rash noted. She is not diaphoretic.  Psychiatric: She has a normal mood and affect. Her behavior is normal.    ED Course  Procedures (including critical care time)  Labs Reviewed - No data to display Dg Chest 2 View  06/15/2012  *RADIOLOGY REPORT*  Clinical Data: Chest pain.  Shortness breath.  Thyroid cancer.  CHEST - 2 VIEW  Comparison:  09/21/2010  Findings:  The heart  size and mediastinal contours are within normal limits.  Both lungs are clear.  Surgical clips are seen from previous thyroidectomy.  The visualized skeletal structures are unremarkable.  IMPRESSION: No active cardiopulmonary disease.   Original Report Authenticated By: Danae Orleans, M.D.      No diagnosis found.    MDM  URI  Pt CXR negative for acute infiltrate. Patients symptoms are consistent with URI, likely viral etiology. Discussed that antibiotics are not indicated for viral infections. Pt will be discharged with symptomatic treatment.  Verbalizes understanding and is agreeable with plan. Pt is hemodynamically stable & in NAD prior to dc.         Jaci Carrel, [place] 06/15/12 2131

## 2012-06-16 NOTE — ED Provider Notes (Signed)
Medical screening examination/treatment/procedure(s) were performed by non-physician practitioner and as supervising physician I was immediately available for consultation/collaboration.  John-Adam Muhamed Luecke, M.D.     John-Adam Evangela Heffler, MD 06/16/12 0218 

## 2012-07-04 ENCOUNTER — Emergency Department (HOSPITAL_COMMUNITY)
Admission: EM | Admit: 2012-07-04 | Discharge: 2012-07-04 | Disposition: A | Discharge status: Home / Self Care | Payer: 59 | Attending: Emergency Medicine | Admitting: Emergency Medicine

## 2012-07-04 ENCOUNTER — Encounter (HOSPITAL_COMMUNITY): Payer: Self-pay | Admitting: Emergency Medicine

## 2012-07-04 DIAGNOSIS — Z79899 Other long term (current) drug therapy: Secondary | ICD-10-CM | POA: Insufficient documentation

## 2012-07-04 DIAGNOSIS — Y9389 Activity, other specified: Secondary | ICD-10-CM | POA: Insufficient documentation

## 2012-07-04 DIAGNOSIS — I10 Essential (primary) hypertension: Secondary | ICD-10-CM | POA: Insufficient documentation

## 2012-07-04 DIAGNOSIS — R51 Headache: Secondary | ICD-10-CM | POA: Insufficient documentation

## 2012-07-04 DIAGNOSIS — S139XXA Sprain of joints and ligaments of unspecified parts of neck, initial encounter: Secondary | ICD-10-CM | POA: Insufficient documentation

## 2012-07-04 DIAGNOSIS — E039 Hypothyroidism, unspecified: Secondary | ICD-10-CM | POA: Insufficient documentation

## 2012-07-04 DIAGNOSIS — IMO0002 Reserved for concepts with insufficient information to code with codable children: Secondary | ICD-10-CM | POA: Insufficient documentation

## 2012-07-04 MED ORDER — IBUPROFEN 800 MG PO TABS
800.0000 mg | ORAL_TABLET | Freq: Once | ORAL | Status: AC
  Administered 2012-07-04: 800 mg via ORAL
  Filled 2012-07-04: qty 1

## 2012-07-04 MED ORDER — CYCLOBENZAPRINE HCL 10 MG PO TABS: 5.0000 mg | ORAL_TABLET | Freq: Two times a day (BID) | ORAL | Status: DC | PRN

## 2012-07-04 MED ORDER — OXYCODONE-ACETAMINOPHEN 5-325 MG PO TABS
1.0000 | ORAL_TABLET | Freq: Once | ORAL | Status: AC
  Administered 2012-07-04: 1 via ORAL
  Filled 2012-07-04: qty 1

## 2012-07-04 MED ORDER — CYCLOBENZAPRINE HCL 10 MG PO TABS
5.0000 mg | ORAL_TABLET | Freq: Once | ORAL | Status: AC
  Administered 2012-07-04: 5 mg via ORAL
  Filled 2012-07-04: qty 1

## 2012-07-04 MED ORDER — IBUPROFEN 600 MG PO TABS: 600.0000 mg | ORAL_TABLET | Freq: Four times a day (QID) | ORAL | Status: DC | PRN

## 2012-07-04 MED ORDER — HYDROCODONE-ACETAMINOPHEN 5-500 MG PO TABS: 1.0000 | ORAL_TABLET | Freq: Four times a day (QID) | ORAL | Status: DC | PRN
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
## 2012-07-04 NOTE — ED Provider Notes (Signed)
Medical screening examination/treatment/procedure(s) were performed by non-physician practitioner and as supervising physician I was immediately available for consultation/collaboration.  Monti Jilek, MD 07/04/12 2357 
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
## 2012-10-08 ENCOUNTER — Other Ambulatory Visit: Payer: Self-pay | Admitting: Internal Medicine

## 2012-10-08 DIAGNOSIS — C73 Malignant neoplasm of thyroid gland: Secondary | ICD-10-CM

## 2012-10-22 ENCOUNTER — Other Ambulatory Visit: Payer: 59

## 2012-11-15 ENCOUNTER — Other Ambulatory Visit: Payer: Self-pay | Admitting: Family Medicine

## 2012-12-10 ENCOUNTER — Ambulatory Visit: Payer: 59

## 2013-01-16 ENCOUNTER — Other Ambulatory Visit: Payer: Self-pay | Admitting: Dermatology

## 2013-02-20 ENCOUNTER — Ambulatory Visit (HOSPITAL_COMMUNITY)
Admission: RE | Admit: 2013-02-20 | Discharge: 2013-02-20 | Disposition: A | Discharge status: Home / Self Care | Payer: 59 | Source: Ambulatory Visit | Attending: Family Medicine | Admitting: Family Medicine

## 2013-02-20 DIAGNOSIS — Z1231 Encounter for screening mammogram for malignant neoplasm of breast: Secondary | ICD-10-CM | POA: Insufficient documentation

## 2013-02-23 ENCOUNTER — Encounter (HOSPITAL_COMMUNITY): Payer: Self-pay | Admitting: Emergency Medicine

## 2013-02-23 ENCOUNTER — Emergency Department (HOSPITAL_COMMUNITY)
Admission: EM | Admit: 2013-02-23 | Discharge: 2013-02-23 | Disposition: A | Discharge status: Home / Self Care | Payer: 59 | Attending: Emergency Medicine | Admitting: Emergency Medicine

## 2013-02-23 DIAGNOSIS — E039 Hypothyroidism, unspecified: Secondary | ICD-10-CM | POA: Insufficient documentation

## 2013-02-23 DIAGNOSIS — Z79899 Other long term (current) drug therapy: Secondary | ICD-10-CM | POA: Insufficient documentation

## 2013-02-23 DIAGNOSIS — G43909 Migraine, unspecified, not intractable, without status migrainosus: Secondary | ICD-10-CM

## 2013-02-23 DIAGNOSIS — Z880 Allergy status to penicillin: Secondary | ICD-10-CM | POA: Insufficient documentation

## 2013-02-23 DIAGNOSIS — R110 Nausea: Secondary | ICD-10-CM | POA: Insufficient documentation

## 2013-02-23 DIAGNOSIS — J3489 Other specified disorders of nose and nasal sinuses: Secondary | ICD-10-CM | POA: Insufficient documentation

## 2013-02-23 DIAGNOSIS — J329 Chronic sinusitis, unspecified: Secondary | ICD-10-CM | POA: Insufficient documentation

## 2013-02-23 DIAGNOSIS — I10 Essential (primary) hypertension: Secondary | ICD-10-CM | POA: Insufficient documentation

## 2013-02-23 LAB — CBC WITH DIFFERENTIAL/PLATELET
Basophils Relative: 1 % (ref 0–1)
HCT: 40.2 % (ref 36.0–46.0)
Hemoglobin: 13.8 g/dL (ref 12.0–15.0)
Lymphs Abs: 1.8 10*3/uL (ref 0.7–4.0)
MCH: 28.4 pg (ref 26.0–34.0)
MCHC: 34.3 g/dL (ref 30.0–36.0)
Monocytes Absolute: 0.6 10*3/uL (ref 0.1–1.0)
Monocytes Relative: 11 % (ref 3–12)
Neutro Abs: 2.5 10*3/uL (ref 1.7–7.7)

## 2013-02-23 LAB — BASIC METABOLIC PANEL
BUN: 9 mg/dL (ref 6–23)
Chloride: 102 mEq/L (ref 96–112)
Creatinine, Ser: 1.04 mg/dL (ref 0.50–1.10)
GFR calc Af Amer: 70 mL/min — ABNORMAL LOW (ref >90)
Glucose, Bld: 93 mg/dL (ref 70–99)

## 2013-02-23 MED ORDER — IBUPROFEN 600 MG PO TABS: 600.0000 mg | ORAL_TABLET | Freq: Four times a day (QID) | ORAL | Status: DC | PRN

## 2013-02-23 MED ORDER — METOCLOPRAMIDE HCL 10 MG PO TABS: 10.0000 mg | ORAL_TABLET | Freq: Four times a day (QID) | ORAL | Status: DC | PRN

## 2013-02-23 MED ORDER — MOMETASONE FUROATE 50 MCG/ACT NA SUSP: 2.0000 | Freq: Every day | NASAL | Status: DC

## 2013-02-23 MED ORDER — SODIUM CHLORIDE 0.9 % IV BOLUS (SEPSIS)
500.0000 mL | Freq: Once | INTRAVENOUS | Status: AC
  Administered 2013-02-23: 500 mL via INTRAVENOUS

## 2013-02-23 MED ORDER — OXYMETAZOLINE HCL 0.05 % NA SOLN: 2.0000 | Freq: Two times a day (BID) | NASAL | Status: DC

## 2013-02-23 MED ORDER — METOCLOPRAMIDE HCL 5 MG/ML IJ SOLN
10.0000 mg | Freq: Once | INTRAMUSCULAR | Status: AC
  Administered 2013-02-23: 10 mg via INTRAVENOUS
  Filled 2013-02-23: qty 2

## 2013-02-23 MED ORDER — KETOROLAC TROMETHAMINE 30 MG/ML IJ SOLN
30.0000 mg | Freq: Once | INTRAMUSCULAR | Status: AC
  Administered 2013-02-23: 30 mg via INTRAVENOUS
  Filled 2013-02-23: qty 1

## 2013-02-23 MED ORDER — AZITHROMYCIN 250 MG PO TABS: ORAL_TABLET | ORAL | Status: DC

## 2013-02-23 MED ORDER — MORPHINE SULFATE 4 MG/ML IJ SOLN
4.0000 mg | Freq: Once | INTRAMUSCULAR | Status: AC
  Administered 2013-02-23: 4 mg via INTRAVENOUS
  Filled 2013-02-23: qty 1

## 2013-02-23 NOTE — ED Provider Notes (Signed)
History     CSN: [id number]  Arrival date & time 02/23/13  1706   First MD Initiated Contact with Patient 02/23/13 1738      Chief Complaint  Patient presents with  . Headache    (Consider location/radiation/quality/duration/timing/severity/associated sxs/prior treatment) HPI Pt with frontal/facial head ache of gradual onset starting last evening. +photophobia and phonophobia. +nasal congestion. No fever chills, No neck pain or stiffness. +nausea. No abdominal pain. No focal weakness, or sensory changes.  Past Medical History  Diagnosis Date  . Hypertension   . Hypothyroidism     Past Surgical History  Procedure Laterality Date  . Thyroidectomy  2012  . Cesarean section  1990  . Vaginal hysterectomy  2012    Family History  Problem Relation Age of Onset  . Colon cancer Maternal Grandfather     History  Substance Use Topics  . Smoking status: Never Smoker   . Smokeless tobacco: Never Used  . Alcohol Use: Yes     Comment: occasional    OB History   Grav Para Term Preterm Abortions TAB SAB Ect Mult Living                  Review of Systems  Constitutional: Negative for fever and chills.  HENT: Positive for congestion and sinus pressure. Negative for sore throat, neck pain, neck stiffness, dental problem and voice change.   Respiratory: Negative for shortness of breath and wheezing.   Cardiovascular: Negative for chest pain and palpitations.  Gastrointestinal: Positive for nausea. Negative for vomiting, abdominal pain and constipation.  Musculoskeletal: Negative for myalgias and back pain.  Skin: Negative for rash and wound.  Neurological: Positive for headaches. Negative for dizziness, syncope, weakness, light-headedness and numbness.  All other systems reviewed and are negative.    Allergies  Penicillins; Hydrocodone; and Latex  Home Medications   Current Outpatient Rx  Name  Route  Sig  Dispense  Refill  . cholecalciferol (VITAMIN D) 1000 UNITS  tablet   Oral   Take 1,000 Units by mouth daily.         Marland Kitchen ibuprofen (ADVIL,MOTRIN) 600 MG tablet   Oral   Take 1 tablet (600 mg total) by mouth every 6 (six) hours as needed for pain.   30 tablet   0   . levothyroxine (SYNTHROID, LEVOTHROID) 112 MCG tablet   Oral   Take 112 mcg by mouth daily.         Marland Kitchen loratadine-pseudoephedrine (CLARITIN-D 24-HOUR) 10-240 MG per 24 hr tablet   Oral   Take 1 tablet by mouth daily as needed for allergies.         Marland Kitchen losartan-hydrochlorothiazide (HYZAAR) 50-12.5 MG per tablet   Oral   Take 1 tablet by mouth daily.         Marland Kitchen omeprazole (PRILOSEC) 20 MG capsule   Oral   Take 20 mg by mouth daily as needed. For acid reflux         . azithromycin (ZITHROMAX Z-PAK) 250 MG tablet      2 po day one, then 1 daily x 4 days   5 tablet   0   . ibuprofen (ADVIL,MOTRIN) 600 MG tablet   Oral   Take 1 tablet (600 mg total) by mouth every 6 (six) hours as needed for pain.   30 tablet   0   . metoCLOPramide (REGLAN) 10 MG tablet   Oral   Take 1 tablet (10 mg total) by mouth every 6 (six)  hours as needed (nausea/headache).   6 tablet   0   . mometasone (NASONEX) 50 MCG/ACT nasal spray   Nasal   Place 2 sprays into the nose daily.   17 g   12   . oxymetazoline (AFRIN NASAL SPRAY) 0.05 % nasal spray   Nasal   Place 2 sprays into the nose 2 (two) times daily.   30 mL   0     BP 156/86  Pulse 92  Temp(Src) 98.7 F (37.1 C) (Oral)  Resp 18  Ht 5' 5.5" (1.664 m)  Wt 168 lb (76.204 kg)  BMI 27.52 kg/m2  SpO2 100%  Physical Exam  Nursing note and vitals reviewed. Constitutional: She is oriented to person, place, and time. She appears well-developed and well-nourished. No distress.  HENT:  Head: Normocephalic and atraumatic.  Mouth/Throat: Oropharynx is clear and moist.  Tenderness of L frontal and maxillary sinus greater than R.   Eyes: EOM are normal. Pupils are equal, round, and reactive to light.  Neck: Normal range of  motion. Neck supple.  No meningeal signs.   Cardiovascular: Normal rate and regular rhythm.   Pulmonary/Chest: Effort normal and breath sounds normal. No respiratory distress. She has no wheezes. She has no rales. She exhibits no tenderness.  Abdominal: Soft. Bowel sounds are normal. She exhibits no distension and no mass. There is no tenderness. There is no rebound and no guarding.  Musculoskeletal: Normal range of motion. She exhibits no edema and no tenderness.  Neurological: She is alert and oriented to person, place, and time.  5/5 motor in all ext, sensation intact  Skin: Skin is warm and dry. No rash noted. No erythema.  Psychiatric: She has a normal mood and affect. Her behavior is normal.    ED Course  Procedures (including critical care time)  Labs Reviewed  BASIC METABOLIC PANEL - Abnormal; Notable for the following:    Potassium 3.3 (*)    GFR calc non Af Amer 61 (*)    GFR calc Af Amer 70 (*)    All other components within normal limits  CBC WITH DIFFERENTIAL   No results found.   1. Sinusitis   2. Migraine       MDM   HA has sinus and migraine features. Do not suspect SAH or meningitis. Return precautions given       Loren Racer, MD 02/23/13 [phone]

## 2013-02-23 NOTE — ED Notes (Signed)
Family at bedside. 

## 2013-02-23 NOTE — ED Notes (Signed)
Pt presents to ED with complaints of headache that started lastnight. Pt reports pressure around top of head sensitivity to light with floaters and nausea.

## 2013-02-24 ENCOUNTER — Telehealth (HOSPITAL_COMMUNITY): Payer: Self-pay | Admitting: Emergency Medicine

## 2013-02-24 NOTE — ED Notes (Signed)
Pt call unable to afford Z Pak.  Will take chart to MD for review.  Wants Rx called to [facility] [phone] if able to get changed

## 2013-02-24 NOTE — ED Notes (Signed)
Chart reviewed by B. Beck PA "Cipro 500 mg BID x 7 days" Pt notified of new Rx and Rx called and given to [facility] @ [facility] [phone].

## 2013-04-16 ENCOUNTER — Other Ambulatory Visit: Payer: Self-pay | Admitting: Internal Medicine

## 2013-04-16 DIAGNOSIS — C73 Malignant neoplasm of thyroid gland: Secondary | ICD-10-CM

## 2013-04-26 ENCOUNTER — Other Ambulatory Visit: Payer: Self-pay | Admitting: Otolaryngology

## 2013-04-26 DIAGNOSIS — J329 Chronic sinusitis, unspecified: Secondary | ICD-10-CM

## 2013-04-26 DIAGNOSIS — R0981 Nasal congestion: Secondary | ICD-10-CM

## 2013-05-03 ENCOUNTER — Other Ambulatory Visit (HOSPITAL_COMMUNITY): Payer: Self-pay | Admitting: Otolaryngology

## 2013-05-03 DIAGNOSIS — R0981 Nasal congestion: Secondary | ICD-10-CM

## 2013-05-07 ENCOUNTER — Ambulatory Visit (HOSPITAL_COMMUNITY)
Admission: RE | Admit: 2013-05-07 | Discharge: 2013-05-07 | Disposition: A | Discharge status: Home / Self Care | Payer: 59 | Source: Ambulatory Visit | Attending: Otolaryngology | Admitting: Otolaryngology

## 2013-05-07 DIAGNOSIS — R0981 Nasal congestion: Secondary | ICD-10-CM

## 2013-05-07 DIAGNOSIS — J3489 Other specified disorders of nose and nasal sinuses: Secondary | ICD-10-CM | POA: Insufficient documentation

## 2013-09-16 ENCOUNTER — Emergency Department (HOSPITAL_COMMUNITY): Payer: BC Managed Care – PPO

## 2013-09-16 ENCOUNTER — Emergency Department (HOSPITAL_COMMUNITY)
Admission: EM | Admit: 2013-09-16 | Discharge: 2013-09-17 | Disposition: A | Discharge status: Home / Self Care | Payer: BC Managed Care – PPO | Attending: Emergency Medicine | Admitting: Emergency Medicine

## 2013-09-16 ENCOUNTER — Encounter (HOSPITAL_COMMUNITY): Payer: Self-pay | Admitting: Emergency Medicine

## 2013-09-16 DIAGNOSIS — R0789 Other chest pain: Secondary | ICD-10-CM | POA: Insufficient documentation

## 2013-09-16 DIAGNOSIS — R002 Palpitations: Secondary | ICD-10-CM | POA: Insufficient documentation

## 2013-09-16 DIAGNOSIS — I10 Essential (primary) hypertension: Secondary | ICD-10-CM | POA: Insufficient documentation

## 2013-09-16 DIAGNOSIS — Z79899 Other long term (current) drug therapy: Secondary | ICD-10-CM | POA: Insufficient documentation

## 2013-09-16 DIAGNOSIS — Z880 Allergy status to penicillin: Secondary | ICD-10-CM | POA: Insufficient documentation

## 2013-09-16 DIAGNOSIS — Z91040 Latex allergy status: Secondary | ICD-10-CM | POA: Insufficient documentation

## 2013-09-16 DIAGNOSIS — E039 Hypothyroidism, unspecified: Secondary | ICD-10-CM | POA: Insufficient documentation

## 2013-09-16 DIAGNOSIS — Z85850 Personal history of malignant neoplasm of thyroid: Secondary | ICD-10-CM | POA: Insufficient documentation

## 2013-09-16 DIAGNOSIS — IMO0002 Reserved for concepts with insufficient information to code with codable children: Secondary | ICD-10-CM | POA: Insufficient documentation

## 2013-09-16 DIAGNOSIS — Z9089 Acquired absence of other organs: Secondary | ICD-10-CM | POA: Insufficient documentation

## 2013-09-16 LAB — CBC
HCT: 42.3 % (ref 36.0–46.0)
HEMOGLOBIN: 14.5 g/dL (ref 12.0–15.0)
MCH: 29.4 pg (ref 26.0–34.0)
MCHC: 34.3 g/dL (ref 30.0–36.0)
MCV: 85.6 fL (ref 78.0–100.0)
PLATELETS: 282 10*3/uL (ref 150–400)
RBC: 4.94 MIL/uL (ref 3.87–5.11)
RDW: 13.5 % (ref 11.5–15.5)
WBC: 5.6 10*3/uL (ref 4.0–10.5)

## 2013-09-16 LAB — POCT I-STAT TROPONIN I: Troponin i, poc: 0 ng/mL (ref 0.00–0.08)

## 2013-09-16 LAB — BASIC METABOLIC PANEL
BUN: 9 mg/dL (ref 6–23)
CHLORIDE: 98 meq/L (ref 96–112)
CO2: 29 meq/L (ref 19–32)
Calcium: 9.7 mg/dL (ref 8.4–10.5)
Creatinine, Ser: 0.92 mg/dL (ref 0.50–1.10)
GFR calc Af Amer: 82 mL/min — ABNORMAL LOW (ref >90)
GFR calc non Af Amer: 70 mL/min — ABNORMAL LOW (ref >90)
Glucose, Bld: 102 mg/dL — ABNORMAL HIGH (ref 70–99)
POTASSIUM: 2.9 meq/L — AB (ref 3.7–5.3)
SODIUM: 139 meq/L (ref 137–147)

## 2013-09-16 LAB — TROPONIN I: Troponin I: <0.3 ng/mL (ref <0.30)

## 2013-09-16 LAB — D-DIMER, QUANTITATIVE (NOT AT ARMC)

## 2013-09-16 LAB — MAGNESIUM: Magnesium: 1.8 mg/dL (ref 1.5–2.5)

## 2013-09-16 MED ORDER — ASPIRIN EC 81 MG PO TBEC: 81.0000 mg | DELAYED_RELEASE_TABLET | Freq: Every day | ORAL | Status: DC

## 2013-09-16 MED ORDER — POTASSIUM CHLORIDE CRYS ER 20 MEQ PO TBCR
60.0000 meq | EXTENDED_RELEASE_TABLET | Freq: Once | ORAL | Status: AC
Start: 2013-09-16 — End: 2013-09-16
  Administered 2013-09-16: 60 meq via ORAL
  Filled 2013-09-16: qty 3

## 2013-09-16 MED ORDER — POTASSIUM CHLORIDE CRYS ER 20 MEQ PO TBCR
40.0000 meq | EXTENDED_RELEASE_TABLET | Freq: Once | ORAL | Status: DC
Start: 2013-09-16 — End: 2013-09-16

## 2013-09-16 NOTE — ED Notes (Signed)
Presents with Sharp chest pain began at 17:30 associated with SOB and pain worse with deep inspiration and dizziness.  Denies nausea. SAts 100% RA. Pain radiates into throat and upper chest. Bilateral breath sounds clear.

## 2013-09-16 NOTE — Discharge Instructions (Signed)
We saw you in the ER for the chest pain/shortness of breath. All of our cardiac workup is normal, including labs, EKG and chest X-RAY are normal. We are not sure what is causing your discomfort, but we feel comfortable sending you home at this time. The workup in the ER is not complete, and you should follow up with your primary care doctor for further evaluation.   Chest Pain (Nonspecific) Chest pain has many causes. Your pain could be caused by something serious, such as a heart attack or a blood clot in the lungs. It could also be caused by something less serious, such as a chest bruise or a virus. Follow up with your doctor. More lab tests or other studies may be needed to find the cause of your pain. Most of the time, nonspecific chest pain will improve within 2 to 3 days of rest and mild pain medicine. HOME CARE  For chest bruises, you may put ice on the sore area for 15-20 minutes, 03-04 times a day. Do this only if it makes you feel better.  Put ice in a plastic bag.  Place a towel between the skin and the bag.  Rest for the next 2 to 3 days.  Go back to work if the pain improves.  See your doctor if the pain lasts longer than 1 to 2 weeks.  Only take medicine as told by your doctor.  Quit smoking if you smoke. GET HELP RIGHT AWAY IF:   There is more pain or pain that spreads to the arm, neck, jaw, back, or belly (abdomen).  You have shortness of breath.  You cough more than usual or cough up blood.  You have very bad back or belly pain, feel sick to your stomach (nauseous), or throw up (vomit).  You have very bad weakness.  You pass out (faint).  You have a fever. Any of these problems may be serious and may be an emergency. Do not wait to see if the problems will go away. Get medical help right away. Call your local emergency services 911 in [place] Do not drive yourself to the hospital. MAKE SURE YOU:   Understand these instructions.  Will watch this  condition.  Will get help right away if you or your child is not doing well or gets worse. Document Released: 02/08/2008 Document Revised: 11/14/2011 Document Reviewed: 02/08/2008 [facility] Patient Information 2014 [place], [place].

## 2013-09-16 NOTE — ED Provider Notes (Signed)
CSN: [id number]     Arrival date & time 09/16/13  1802 History   First MD Initiated Contact with Patient 09/16/13 1927     Chief Complaint  Patient presents with  . Chest Pain   (Consider location/radiation/quality/duration/timing/severity/associated sxs/prior Treatment) HPI Comments: Pt comes in with cc of chest pain. Pt has hx of HTN, thyroid cancer in 2012 s/p resection. Pt comes in with chest pain, right sided. The pain is described as sharp pain, non radiating, that started after she climbed 3 flight of stairs. She had some dib as well - but that is not unusual. She started feeling some palpitations, and got concerned and decided to come to the ER. En route to the ER her sx resolved. She has no hx of PE, DVT, no hx of chest pains.   Patient is a 53 y.o. female presenting with chest pain. The history is provided by the patient.  Chest Pain Associated symptoms: palpitations and shortness of breath   Associated symptoms: no abdominal pain, no headache, no nausea and not vomiting     Past Medical History  Diagnosis Date  . Hypertension   . Hypothyroidism    Past Surgical History  Procedure Laterality Date  . Thyroidectomy  2012  . Cesarean section  1990  . Vaginal hysterectomy  2012   Family History  Problem Relation Age of Onset  . Colon cancer Maternal Grandfather    History  Substance Use Topics  . Smoking status: Never Smoker   . Smokeless tobacco: Never Used  . Alcohol Use: Yes     Comment: occasional   OB History   Grav Para Term Preterm Abortions TAB SAB Ect Mult Living                 Review of Systems  Constitutional: Negative for activity change.  Respiratory: Positive for shortness of breath.   Cardiovascular: Positive for chest pain and palpitations. Negative for leg swelling.  Gastrointestinal: Negative for nausea, vomiting and abdominal pain.  Genitourinary: Negative for dysuria.  Musculoskeletal: Negative for neck pain.  Neurological: Negative for  syncope and headaches.    Allergies  Penicillins; Hydrocodone; and Latex  Home Medications   Current Outpatient Rx  Name  Route  Sig  Dispense  Refill  . cholecalciferol (VITAMIN D) 1000 UNITS tablet   Oral   Take 1,000 Units by mouth daily.         Marland Kitchen ibuprofen (ADVIL,MOTRIN) 600 MG tablet   Oral   Take 1 tablet (600 mg total) by mouth every 6 (six) hours as needed for pain.   30 tablet   0   . levothyroxine (SYNTHROID, LEVOTHROID) 112 MCG tablet   Oral   Take 112 mcg by mouth daily.         Marland Kitchen loratadine-pseudoephedrine (CLARITIN-D 24-HOUR) 10-240 MG per 24 hr tablet   Oral   Take 1 tablet by mouth daily as needed for allergies.         Marland Kitchen losartan-hydrochlorothiazide (HYZAAR) 50-12.5 MG per tablet   Oral   Take 1 tablet by mouth daily.         . mometasone (NASONEX) 50 MCG/ACT nasal spray   Nasal   Place 2 sprays into the nose daily.   17 g   12   . omeprazole (PRILOSEC) 20 MG capsule   Oral   Take 20 mg by mouth daily as needed. For acid reflux         . oxymetazoline (AFRIN NASAL SPRAY)  0.05 % nasal spray   Nasal   Place 2 sprays into the nose 2 (two) times daily.   30 mL   0    BP 145/94  Pulse 90  Temp(Src) 98.3 F (36.8 C) (Oral)  Resp 18  SpO2 100% Physical Exam  Nursing note and vitals reviewed. Constitutional: She is oriented to person, place, and time. She appears well-developed and well-nourished.  HENT:  Head: Normocephalic and atraumatic.  Eyes: EOM are normal. Pupils are equal, round, and reactive to light.  Neck: Neck supple.  Cardiovascular: Normal rate, regular rhythm and normal heart sounds.   No murmur heard. Pulmonary/Chest: Effort normal. No respiratory distress.  Abdominal: Soft. She exhibits no distension. There is no tenderness. There is no rebound and no guarding.  Neurological: She is alert and oriented to person, place, and time.  Skin: Skin is warm and dry.    ED Course  Procedures (including critical care  time) Labs Review Labs Reviewed  BASIC METABOLIC PANEL - Abnormal; Notable for the following:    Potassium 2.9 (*)    Glucose, Bld 102 (*)    GFR calc non Af Amer 70 (*)    GFR calc Af Amer 82 (*)    All other components within normal limits  CBC  MAGNESIUM  D-DIMER, QUANTITATIVE  TROPONIN I  POCT I-STAT TROPONIN I   Imaging Review Dg Chest 2 View  09/16/2013   CLINICAL DATA:  Palpitations.  Mid chest pain.  EXAM: CHEST  2 VIEW  COMPARISON:  DG CHEST 2 VIEW dated 06/15/2012  FINDINGS: Cardiopericardial silhouette within normal limits. Mediastinal contours normal. Trachea midline. No airspace disease or effusion.  IMPRESSION: No active cardiopulmonary disease.   Electronically Signed   By: Dereck Ligas M.D.   On: 09/16/2013 20:05    EKG Interpretation    Date/Time:  Monday September 16 2013 18:05:37 EST Ventricular Rate:  104 PR Interval:  156 QRS Duration: 88 QT Interval:  356 QTC Calculation: 468 R Axis:   17 Text Interpretation:  Sinus tachycardia Nonspecific ST and T wave abnormality Abnormal ECG Confirmed by Kathrynn Humble, MD, [place] ([id number]) on 09/16/2013 7:46:27 PM            MDM  No diagnosis found. Differential diagnosis includes: ACS syndrome CHF exacerbation Valvular disorder Myocarditis Pericarditis Pericardial effusion Pneumonia Pleural effusion Pulmonary edema PE Anemia Musculoskeletal pain  Pt comes in with cc of chest pain, dib - now chest pain free. Pt had an atypical right sided chest pain. Her HEART score is 2 - if the troponins x 2 are negative. I think if serial trops are neg in ED, she is stable to go home with PCP f/u. EKG is normal. We also ordered d-dimer - as she told me that chest pain was worse at the onset with inspiration and she felt like she was having trouble breathing - her WELLS is 1.5 for tachycardia - and her dimer is negative.  Will d/c after trops x 2.  Varney Biles, MD 09/16/13 2129

## 2013-09-16 NOTE — ED Notes (Signed)
EDP at BS 

## 2013-09-16 NOTE — ED Notes (Signed)
Pt reports hx of thyroid CA and had a thyroidectomy in 2012. CA free since.

## 2013-09-16 NOTE — ED Notes (Signed)
Patient returned from XR. Reconnected all diagnostic leads.

## 2013-10-04 ENCOUNTER — Other Ambulatory Visit: Payer: 59

## 2014-01-16 ENCOUNTER — Ambulatory Visit
Admission: RE | Admit: 2014-01-16 | Discharge: 2014-01-16 | Disposition: A | Discharge status: Home / Self Care | Payer: BC Managed Care – PPO | Source: Ambulatory Visit | Attending: Internal Medicine | Admitting: Internal Medicine

## 2014-01-16 DIAGNOSIS — C73 Malignant neoplasm of thyroid gland: Secondary | ICD-10-CM

## 2014-02-12 ENCOUNTER — Other Ambulatory Visit (HOSPITAL_COMMUNITY): Payer: Self-pay | Admitting: Family Medicine

## 2014-02-12 DIAGNOSIS — Z1231 Encounter for screening mammogram for malignant neoplasm of breast: Secondary | ICD-10-CM

## 2014-02-25 ENCOUNTER — Ambulatory Visit (HOSPITAL_COMMUNITY): Payer: BC Managed Care – PPO

## 2014-02-27 ENCOUNTER — Ambulatory Visit (HOSPITAL_COMMUNITY)
Admission: RE | Admit: 2014-02-27 | Discharge: 2014-02-27 | Disposition: A | Discharge status: Home / Self Care | Payer: BC Managed Care – PPO | Source: Ambulatory Visit | Attending: Family Medicine | Admitting: Family Medicine

## 2014-02-27 DIAGNOSIS — Z1231 Encounter for screening mammogram for malignant neoplasm of breast: Secondary | ICD-10-CM | POA: Insufficient documentation

## 2014-03-25 ENCOUNTER — Other Ambulatory Visit (HOSPITAL_COMMUNITY): Payer: Self-pay | Admitting: Family Medicine

## 2014-03-25 DIAGNOSIS — M545 Low back pain: Secondary | ICD-10-CM

## 2014-04-04 ENCOUNTER — Ambulatory Visit (HOSPITAL_COMMUNITY): Admission: RE | Admit: 2014-04-04 | Payer: BC Managed Care – PPO | Source: Ambulatory Visit

## 2014-11-21 ENCOUNTER — Ambulatory Visit: Payer: Self-pay | Admitting: Internal Medicine

## 2014-12-16 ENCOUNTER — Encounter (HOSPITAL_COMMUNITY): Payer: Self-pay

## 2014-12-16 ENCOUNTER — Emergency Department (HOSPITAL_COMMUNITY)
Admission: EM | Admit: 2014-12-16 | Discharge: 2014-12-16 | Disposition: A | Discharge status: Home / Self Care | Payer: Managed Care, Other (non HMO) | Attending: Emergency Medicine | Admitting: Emergency Medicine

## 2014-12-16 DIAGNOSIS — M25551 Pain in right hip: Secondary | ICD-10-CM

## 2014-12-16 DIAGNOSIS — Y998 Other external cause status: Secondary | ICD-10-CM | POA: Insufficient documentation

## 2014-12-16 DIAGNOSIS — S4992XA Unspecified injury of left shoulder and upper arm, initial encounter: Secondary | ICD-10-CM | POA: Diagnosis not present

## 2014-12-16 DIAGNOSIS — Z880 Allergy status to penicillin: Secondary | ICD-10-CM | POA: Insufficient documentation

## 2014-12-16 DIAGNOSIS — S29092A Other injury of muscle and tendon of back wall of thorax, initial encounter: Secondary | ICD-10-CM | POA: Diagnosis not present

## 2014-12-16 DIAGNOSIS — Y9389 Activity, other specified: Secondary | ICD-10-CM | POA: Diagnosis not present

## 2014-12-16 DIAGNOSIS — S0990XA Unspecified injury of head, initial encounter: Secondary | ICD-10-CM | POA: Insufficient documentation

## 2014-12-16 DIAGNOSIS — Y92410 Unspecified street and highway as the place of occurrence of the external cause: Secondary | ICD-10-CM | POA: Insufficient documentation

## 2014-12-16 DIAGNOSIS — I10 Essential (primary) hypertension: Secondary | ICD-10-CM | POA: Insufficient documentation

## 2014-12-16 DIAGNOSIS — Z7951 Long term (current) use of inhaled steroids: Secondary | ICD-10-CM | POA: Insufficient documentation

## 2014-12-16 DIAGNOSIS — E039 Hypothyroidism, unspecified: Secondary | ICD-10-CM | POA: Diagnosis not present

## 2014-12-16 DIAGNOSIS — G44219 Episodic tension-type headache, not intractable: Secondary | ICD-10-CM

## 2014-12-16 DIAGNOSIS — Z91040 Latex allergy status: Secondary | ICD-10-CM | POA: Diagnosis not present

## 2014-12-16 DIAGNOSIS — Z79899 Other long term (current) drug therapy: Secondary | ICD-10-CM | POA: Diagnosis not present

## 2014-12-16 DIAGNOSIS — Z7982 Long term (current) use of aspirin: Secondary | ICD-10-CM | POA: Diagnosis not present

## 2014-12-16 DIAGNOSIS — S79912A Unspecified injury of left hip, initial encounter: Secondary | ICD-10-CM | POA: Insufficient documentation

## 2014-12-16 DIAGNOSIS — S79911A Unspecified injury of right hip, initial encounter: Secondary | ICD-10-CM | POA: Diagnosis not present

## 2014-12-16 DIAGNOSIS — S4991XA Unspecified injury of right shoulder and upper arm, initial encounter: Secondary | ICD-10-CM | POA: Insufficient documentation

## 2014-12-16 DIAGNOSIS — M25552 Pain in left hip: Secondary | ICD-10-CM

## 2014-12-16 DIAGNOSIS — S199XXA Unspecified injury of neck, initial encounter: Secondary | ICD-10-CM | POA: Diagnosis not present

## 2014-12-16 DIAGNOSIS — M549 Dorsalgia, unspecified: Secondary | ICD-10-CM

## 2014-12-16 MED ORDER — IBUPROFEN 600 MG PO TABS: 600.0000 mg | ORAL_TABLET | Freq: Four times a day (QID) | ORAL | Status: DC | PRN

## 2014-12-16 MED ORDER — TRAMADOL HCL 50 MG PO TABS: 50.0000 mg | ORAL_TABLET | Freq: Four times a day (QID) | ORAL | Status: DC | PRN
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
## 2014-12-30 ENCOUNTER — Encounter (HOSPITAL_COMMUNITY)
Admission: EM | Disposition: A | Discharge status: Home / Self Care | Payer: Self-pay | Source: Home / Self Care | Attending: Emergency Medicine

## 2014-12-30 ENCOUNTER — Observation Stay (HOSPITAL_COMMUNITY): Payer: Managed Care, Other (non HMO) | Admitting: Anesthesiology

## 2014-12-30 ENCOUNTER — Observation Stay (HOSPITAL_COMMUNITY): Payer: Managed Care, Other (non HMO)

## 2014-12-30 ENCOUNTER — Encounter (HOSPITAL_COMMUNITY): Payer: Self-pay | Admitting: Emergency Medicine

## 2014-12-30 ENCOUNTER — Emergency Department (HOSPITAL_COMMUNITY): Payer: Managed Care, Other (non HMO)

## 2014-12-30 ENCOUNTER — Observation Stay (HOSPITAL_COMMUNITY)
Admission: EM | Admit: 2014-12-30 | Discharge: 2014-12-31 | Disposition: A | Discharge status: Home / Self Care | Payer: Managed Care, Other (non HMO) | Attending: General Surgery | Admitting: General Surgery

## 2014-12-30 DIAGNOSIS — Z7982 Long term (current) use of aspirin: Secondary | ICD-10-CM | POA: Insufficient documentation

## 2014-12-30 DIAGNOSIS — I10 Essential (primary) hypertension: Secondary | ICD-10-CM | POA: Diagnosis present

## 2014-12-30 DIAGNOSIS — K810 Acute cholecystitis: Secondary | ICD-10-CM | POA: Diagnosis present

## 2014-12-30 DIAGNOSIS — F1099 Alcohol use, unspecified with unspecified alcohol-induced disorder: Secondary | ICD-10-CM | POA: Insufficient documentation

## 2014-12-30 DIAGNOSIS — Z90710 Acquired absence of both cervix and uterus: Secondary | ICD-10-CM | POA: Insufficient documentation

## 2014-12-30 DIAGNOSIS — E669 Obesity, unspecified: Secondary | ICD-10-CM | POA: Diagnosis not present

## 2014-12-30 DIAGNOSIS — K8010 Calculus of gallbladder with chronic cholecystitis without obstruction: Principal | ICD-10-CM | POA: Insufficient documentation

## 2014-12-30 DIAGNOSIS — Z880 Allergy status to penicillin: Secondary | ICD-10-CM | POA: Insufficient documentation

## 2014-12-30 DIAGNOSIS — K829 Disease of gallbladder, unspecified: Secondary | ICD-10-CM

## 2014-12-30 DIAGNOSIS — E039 Hypothyroidism, unspecified: Secondary | ICD-10-CM | POA: Diagnosis not present

## 2014-12-30 DIAGNOSIS — Z6831 Body mass index (BMI) 31.0-31.9, adult: Secondary | ICD-10-CM | POA: Insufficient documentation

## 2014-12-30 DIAGNOSIS — Z91040 Latex allergy status: Secondary | ICD-10-CM | POA: Diagnosis not present

## 2014-12-30 DIAGNOSIS — R109 Unspecified abdominal pain: Secondary | ICD-10-CM

## 2014-12-30 DIAGNOSIS — Z85850 Personal history of malignant neoplasm of thyroid: Secondary | ICD-10-CM | POA: Insufficient documentation

## 2014-12-30 DIAGNOSIS — R1013 Epigastric pain: Secondary | ICD-10-CM | POA: Diagnosis present

## 2014-12-30 HISTORY — PX: CHOLECYSTECTOMY: SHX55

## 2014-12-30 LAB — CBC WITH DIFFERENTIAL/PLATELET
BASOS PCT: 0 % (ref 0–1)
Basophils Absolute: 0 10*3/uL (ref 0.0–0.1)
Eosinophils Absolute: 0.1 10*3/uL (ref 0.0–0.7)
Eosinophils Relative: 2 % (ref 0–5)
HEMATOCRIT: 43.6 % (ref 36.0–46.0)
HEMOGLOBIN: 14.4 g/dL (ref 12.0–15.0)
Lymphocytes Relative: 43 % (ref 12–46)
Lymphs Abs: 2.2 10*3/uL (ref 0.7–4.0)
MCH: 28.7 pg (ref 26.0–34.0)
MCHC: 33 g/dL (ref 30.0–36.0)
MCV: 87 fL (ref 78.0–100.0)
Monocytes Absolute: 0.6 10*3/uL (ref 0.1–1.0)
Monocytes Relative: 11 % (ref 3–12)
Neutro Abs: 2.4 10*3/uL (ref 1.7–7.7)
Neutrophils Relative %: 44 % (ref 43–77)
Platelets: 252 10*3/uL (ref 150–400)
RBC: 5.01 MIL/uL (ref 3.87–5.11)
RDW: 13.1 % (ref 11.5–15.5)
WBC: 5.3 10*3/uL (ref 4.0–10.5)

## 2014-12-30 LAB — URINALYSIS, ROUTINE W REFLEX MICROSCOPIC
Bilirubin Urine: NEGATIVE
Glucose, UA: NEGATIVE mg/dL
Hgb urine dipstick: NEGATIVE
Ketones, ur: NEGATIVE mg/dL
LEUKOCYTES UA: NEGATIVE
NITRITE: NEGATIVE
Protein, ur: NEGATIVE mg/dL
SPECIFIC GRAVITY, URINE: 1.007 (ref 1.005–1.030)
UROBILINOGEN UA: 0.2 mg/dL (ref 0.0–1.0)
pH: 6.5 (ref 5.0–8.0)

## 2014-12-30 LAB — COMPREHENSIVE METABOLIC PANEL
ALBUMIN: 4.5 g/dL (ref 3.5–5.2)
ALK PHOS: 72 U/L (ref 39–117)
ALT: 17 U/L (ref 0–35)
AST: 23 U/L (ref 0–37)
Anion gap: 8 (ref 5–15)
BUN: 9 mg/dL (ref 6–23)
CALCIUM: 9.4 mg/dL (ref 8.4–10.5)
CO2: 28 mmol/L (ref 19–32)
Chloride: 102 mmol/L (ref 96–112)
Creatinine, Ser: 1.19 mg/dL — ABNORMAL HIGH (ref 0.50–1.10)
GFR calc Af Amer: 59 mL/min — ABNORMAL LOW (ref >90)
GFR, EST NON AFRICAN AMERICAN: 51 mL/min — AB (ref >90)
Glucose, Bld: 108 mg/dL — ABNORMAL HIGH (ref 70–99)
POTASSIUM: 3.3 mmol/L — AB (ref 3.5–5.1)
Sodium: 138 mmol/L (ref 135–145)
Total Bilirubin: 0.6 mg/dL (ref 0.3–1.2)
Total Protein: 8.2 g/dL (ref 6.0–8.3)

## 2014-12-30 LAB — LIPASE, BLOOD: Lipase: 41 U/L (ref 11–59)

## 2014-12-30 SURGERY — LAPAROSCOPIC CHOLECYSTECTOMY WITH INTRAOPERATIVE CHOLANGIOGRAM
Anesthesia: General | Site: Abdomen

## 2014-12-30 MED ORDER — BUPIVACAINE HCL 0.5 % IJ SOLN
INTRAMUSCULAR | Status: DC | PRN
Start: 2014-12-30 — End: 2014-12-30
  Administered 2014-12-30: 10 mL

## 2014-12-30 MED ORDER — FENTANYL CITRATE (PF) 250 MCG/5ML IJ SOLN
INTRAMUSCULAR | Status: AC
  Filled 2014-12-30: qty 5

## 2014-12-30 MED ORDER — NEOSTIGMINE METHYLSULFATE 10 MG/10ML IV SOLN
INTRAVENOUS | Status: DC | PRN
  Administered 2014-12-30: 5 mg via INTRAVENOUS

## 2014-12-30 MED ORDER — GI COCKTAIL ~~LOC~~
30.0000 mL | Freq: Once | ORAL | Status: AC
  Administered 2014-12-30: 30 mL via ORAL
  Filled 2014-12-30: qty 30

## 2014-12-30 MED ORDER — LACTATED RINGERS IV SOLN
INTRAVENOUS | Status: DC | PRN
  Administered 2014-12-30 (×2): via INTRAVENOUS

## 2014-12-30 MED ORDER — 0.9 % SODIUM CHLORIDE (POUR BTL) OPTIME
TOPICAL | Status: DC | PRN
  Administered 2014-12-30: 1000 mL

## 2014-12-30 MED ORDER — FENTANYL CITRATE (PF) 100 MCG/2ML IJ SOLN: 25.0000 ug | INTRAMUSCULAR | Status: DC | PRN

## 2014-12-30 MED ORDER — PHENYLEPHRINE HCL 10 MG/ML IJ SOLN
INTRAMUSCULAR | Status: DC | PRN
  Administered 2014-12-30 (×4): 80 ug via INTRAVENOUS

## 2014-12-30 MED ORDER — GLYCOPYRROLATE 0.2 MG/ML IJ SOLN
INTRAMUSCULAR | Status: DC | PRN
  Administered 2014-12-30: .8 mg via INTRAVENOUS

## 2014-12-30 MED ORDER — PROPOFOL 10 MG/ML IV BOLUS
INTRAVENOUS | Status: AC
  Filled 2014-12-30: qty 20

## 2014-12-30 MED ORDER — LIDOCAINE HCL (CARDIAC) 20 MG/ML IV SOLN
INTRAVENOUS | Status: AC
  Filled 2014-12-30: qty 5

## 2014-12-30 MED ORDER — LACTATED RINGERS IR SOLN
Status: DC | PRN
  Administered 2014-12-30: 1000 mL

## 2014-12-30 MED ORDER — ONDANSETRON HCL 4 MG/2ML IJ SOLN
4.0000 mg | Freq: Once | INTRAMUSCULAR | Status: AC
  Administered 2014-12-30: 4 mg via INTRAVENOUS
  Filled 2014-12-30: qty 2

## 2014-12-30 MED ORDER — ONDANSETRON HCL 4 MG/2ML IJ SOLN
INTRAMUSCULAR | Status: DC | PRN
  Administered 2014-12-30: 4 mg via INTRAVENOUS

## 2014-12-30 MED ORDER — DEXAMETHASONE SODIUM PHOSPHATE 10 MG/ML IJ SOLN
INTRAMUSCULAR | Status: AC
  Filled 2014-12-30: qty 1

## 2014-12-30 MED ORDER — CIPROFLOXACIN IN D5W 400 MG/200ML IV SOLN
400.0000 mg | INTRAVENOUS | Status: DC
  Filled 2014-12-30: qty 200

## 2014-12-30 MED ORDER — PANTOPRAZOLE SODIUM 40 MG PO TBEC
40.0000 mg | DELAYED_RELEASE_TABLET | Freq: Every day | ORAL | Status: DC
  Filled 2014-12-30: qty 1

## 2014-12-30 MED ORDER — LEVOTHYROXINE SODIUM 112 MCG PO TABS
112.0000 ug | ORAL_TABLET | Freq: Every day | ORAL | Status: DC
  Administered 2014-12-31: 112 ug via ORAL
  Filled 2014-12-30 (×4): qty 1

## 2014-12-30 MED ORDER — CIPROFLOXACIN IN D5W 400 MG/200ML IV SOLN
INTRAVENOUS | Status: DC | PRN
  Administered 2014-12-30: 400 mg via INTRAVENOUS

## 2014-12-30 MED ORDER — ACETAMINOPHEN 325 MG PO TABS: 650.0000 mg | ORAL_TABLET | Freq: Four times a day (QID) | ORAL | Status: DC | PRN

## 2014-12-30 MED ORDER — LACTATED RINGERS IV SOLN: INTRAVENOUS | Status: DC

## 2014-12-30 MED ORDER — IOHEXOL 300 MG/ML  SOLN
50.0000 mL | Freq: Once | INTRAMUSCULAR | Status: AC | PRN
  Administered 2014-12-30: 50 mL via ORAL

## 2014-12-30 MED ORDER — LIDOCAINE HCL (CARDIAC) 20 MG/ML IV SOLN
INTRAVENOUS | Status: DC | PRN
  Administered 2014-12-30: 75 mg via INTRAVENOUS
  Administered 2014-12-30: 25 mg via INTRATRACHEAL

## 2014-12-30 MED ORDER — ROCURONIUM BROMIDE 100 MG/10ML IV SOLN
INTRAVENOUS | Status: AC
  Filled 2014-12-30: qty 1

## 2014-12-30 MED ORDER — DEXAMETHASONE SODIUM PHOSPHATE 10 MG/ML IJ SOLN
INTRAMUSCULAR | Status: DC | PRN
  Administered 2014-12-30: 10 mg via INTRAVENOUS

## 2014-12-30 MED ORDER — GLYCOPYRROLATE 0.2 MG/ML IJ SOLN
INTRAMUSCULAR | Status: AC
  Filled 2014-12-30: qty 3

## 2014-12-30 MED ORDER — NEOSTIGMINE METHYLSULFATE 10 MG/10ML IV SOLN
INTRAVENOUS | Status: AC
  Filled 2014-12-30: qty 1

## 2014-12-30 MED ORDER — FENTANYL CITRATE (PF) 100 MCG/2ML IJ SOLN
INTRAMUSCULAR | Status: DC | PRN
Start: 2014-12-30 — End: 2014-12-30
  Administered 2014-12-30: 100 ug via INTRAVENOUS
  Administered 2014-12-30: 50 ug via INTRAVENOUS
  Administered 2014-12-30: 150 ug via INTRAVENOUS

## 2014-12-30 MED ORDER — TRAMADOL HCL 50 MG PO TABS
50.0000 mg | ORAL_TABLET | Freq: Four times a day (QID) | ORAL | Status: DC | PRN
  Administered 2014-12-30 – 2014-12-31 (×2): 50 mg via ORAL
  Filled 2014-12-30 (×2): qty 1

## 2014-12-30 MED ORDER — HYDROCHLOROTHIAZIDE 12.5 MG PO CAPS
12.5000 mg | ORAL_CAPSULE | Freq: Every day | ORAL | Status: DC
  Filled 2014-12-30: qty 1

## 2014-12-30 MED ORDER — ENOXAPARIN SODIUM 40 MG/0.4ML ~~LOC~~ SOLN
40.0000 mg | SUBCUTANEOUS | Status: DC
  Administered 2014-12-31: 40 mg via SUBCUTANEOUS
  Filled 2014-12-30 (×2): qty 0.4

## 2014-12-30 MED ORDER — PROMETHAZINE HCL 25 MG/ML IJ SOLN
12.5000 mg | Freq: Four times a day (QID) | INTRAMUSCULAR | Status: DC | PRN
  Administered 2014-12-30: 25 mg via INTRAVENOUS
  Filled 2014-12-30 (×2): qty 1

## 2014-12-30 MED ORDER — CIPROFLOXACIN IN D5W 400 MG/200ML IV SOLN
400.0000 mg | Freq: Two times a day (BID) | INTRAVENOUS | Status: DC
  Administered 2014-12-30: 400 mg via INTRAVENOUS
  Filled 2014-12-30 (×2): qty 200

## 2014-12-30 MED ORDER — ONDANSETRON HCL 4 MG/2ML IJ SOLN
4.0000 mg | Freq: Four times a day (QID) | INTRAMUSCULAR | Status: DC | PRN
  Administered 2014-12-30: 4 mg via INTRAVENOUS
  Filled 2014-12-30: qty 2

## 2014-12-30 MED ORDER — LOSARTAN POTASSIUM 50 MG PO TABS
50.0000 mg | ORAL_TABLET | Freq: Every day | ORAL | Status: DC
  Filled 2014-12-30 (×2): qty 1

## 2014-12-30 MED ORDER — MIDAZOLAM HCL 5 MG/5ML IJ SOLN
INTRAMUSCULAR | Status: DC | PRN
  Administered 2014-12-30: 2 mg via INTRAVENOUS

## 2014-12-30 MED ORDER — BUPIVACAINE HCL (PF) 0.5 % IJ SOLN
INTRAMUSCULAR | Status: AC
  Filled 2014-12-30: qty 30

## 2014-12-30 MED ORDER — ACETAMINOPHEN 10 MG/ML IV SOLN
1000.0000 mg | Freq: Once | INTRAVENOUS | Status: AC
  Administered 2014-12-30: 1000 mg via INTRAVENOUS
  Filled 2014-12-30 (×2): qty 100

## 2014-12-30 MED ORDER — ONDANSETRON HCL 4 MG/2ML IJ SOLN: 4.0000 mg | Freq: Once | INTRAMUSCULAR | Status: DC | PRN

## 2014-12-30 MED ORDER — PROPOFOL 10 MG/ML IV BOLUS
INTRAVENOUS | Status: DC | PRN
  Administered 2014-12-30: 180 mg via INTRAVENOUS

## 2014-12-30 MED ORDER — SUCCINYLCHOLINE CHLORIDE 20 MG/ML IJ SOLN
INTRAMUSCULAR | Status: DC | PRN
  Administered 2014-12-30: 100 mg via INTRAVENOUS

## 2014-12-30 MED ORDER — KCL IN DEXTROSE-NACL 40-5-0.9 MEQ/L-%-% IV SOLN
INTRAVENOUS | Status: DC
  Administered 2014-12-30 – 2014-12-31 (×2): via INTRAVENOUS
  Filled 2014-12-30 (×3): qty 1000

## 2014-12-30 MED ORDER — IOHEXOL 300 MG/ML  SOLN
INTRAMUSCULAR | Status: DC | PRN
  Administered 2014-12-30: 50 mL via INTRAVENOUS

## 2014-12-30 MED ORDER — LOSARTAN POTASSIUM-HCTZ 50-12.5 MG PO TABS: 1.0000 | ORAL_TABLET | Freq: Every day | ORAL | Status: DC

## 2014-12-30 MED ORDER — IOHEXOL 300 MG/ML  SOLN
100.0000 mL | Freq: Once | INTRAMUSCULAR | Status: AC | PRN
  Administered 2014-12-30: 100 mL via INTRAVENOUS

## 2014-12-30 MED ORDER — MIDAZOLAM HCL 2 MG/2ML IJ SOLN
INTRAMUSCULAR | Status: AC
  Filled 2014-12-30: qty 2

## 2014-12-30 MED ORDER — ENOXAPARIN SODIUM 40 MG/0.4ML ~~LOC~~ SOLN
40.0000 mg | SUBCUTANEOUS | Status: DC
  Filled 2014-12-30: qty 0.4

## 2014-12-30 MED ORDER — SODIUM CHLORIDE 0.9 % IV BOLUS (SEPSIS)
1000.0000 mL | Freq: Once | INTRAVENOUS | Status: AC
  Administered 2014-12-30: 1000 mL via INTRAVENOUS

## 2014-12-30 MED ORDER — ONDANSETRON HCL 4 MG/2ML IJ SOLN
INTRAMUSCULAR | Status: AC
  Filled 2014-12-30: qty 2

## 2014-12-30 MED ORDER — ROCURONIUM BROMIDE 100 MG/10ML IV SOLN
INTRAVENOUS | Status: DC | PRN
  Administered 2014-12-30: 5 mg via INTRAVENOUS
  Administered 2014-12-30: 35 mg via INTRAVENOUS

## 2014-12-30 MED ORDER — ACETAMINOPHEN 650 MG RE SUPP
650.0000 mg | Freq: Four times a day (QID) | RECTAL | Status: DC | PRN
Start: 2014-12-30 — End: 2014-12-31

## 2014-12-30 SURGICAL SUPPLY — 41 items
APL SKNCLS STERI-STRIP NONHPOA (GAUZE/BANDAGES/DRESSINGS) ×1
APPLIER CLIP 5 13 M/L LIGAMAX5 (MISCELLANEOUS) ×3
APR CLP MED LRG 5 ANG JAW (MISCELLANEOUS) ×1
BAG SPEC RTRVL LRG 6X4 10 (ENDOMECHANICALS) ×1
BENZOIN TINCTURE PRP APPL 2/3 (GAUZE/BANDAGES/DRESSINGS) ×3 IMPLANT
CHLORAPREP W/TINT 26ML (MISCELLANEOUS) ×3 IMPLANT
CLIP APPLIE 5 13 M/L LIGAMAX5 (MISCELLANEOUS) ×1 IMPLANT
CLOSURE STERI-STRIP 1/4X4 (GAUZE/BANDAGES/DRESSINGS) ×2 IMPLANT
CLOSURE WOUND 1/2 X4 (GAUZE/BANDAGES/DRESSINGS) ×1
COVER MAYO STAND STRL (DRAPES) ×3 IMPLANT
DECANTER SPIKE VIAL GLASS SM (MISCELLANEOUS) ×1 IMPLANT
DISSECTOR BLUNT TIP ENDO 5MM (MISCELLANEOUS) IMPLANT
DRAPE C-ARM 42X120 X-RAY (DRAPES) ×3 IMPLANT
DRAPE LAPAROSCOPIC ABDOMINAL (DRAPES) ×3 IMPLANT
DRAPE UTILITY XL STRL (DRAPES) ×3 IMPLANT
DRSG TEGADERM 2-3/8X2-3/4 SM (GAUZE/BANDAGES/DRESSINGS) ×5 IMPLANT
ELECT REM PT RETURN 9FT ADLT (ELECTROSURGICAL) ×3
ELECTRODE REM PT RTRN 9FT ADLT (ELECTROSURGICAL) ×1 IMPLANT
ENDOLOOP SUT PDS II  0 18 (SUTURE)
ENDOLOOP SUT PDS II 0 18 (SUTURE) IMPLANT
GAUZE SPONGE 2X2 8PLY STRL LF (GAUZE/BANDAGES/DRESSINGS) ×1 IMPLANT
GLOVE ECLIPSE 8.0 STRL XLNG CF (GLOVE) ×3 IMPLANT
GLOVE INDICATOR 8.0 STRL GRN (GLOVE) ×3 IMPLANT
GOWN STRL REUS W/TWL XL LVL3 (GOWN DISPOSABLE) ×11 IMPLANT
HEMOSTAT SNOW SURGICEL 2X4 (HEMOSTASIS) IMPLANT
KIT BASIN OR (CUSTOM PROCEDURE TRAY) ×3 IMPLANT
POUCH SPECIMEN RETRIEVAL 10MM (ENDOMECHANICALS) ×3 IMPLANT
SCISSORS LAP 5X35 DISP (ENDOMECHANICALS) ×3 IMPLANT
SET CHOLANGIOGRAPH MIX (MISCELLANEOUS) ×3 IMPLANT
SET IRRIG TUBING LAPAROSCOPIC (IRRIGATION / IRRIGATOR) ×3 IMPLANT
SLEEVE XCEL OPT CAN 5 100 (ENDOMECHANICALS) ×3 IMPLANT
SPONGE GAUZE 2X2 STER 10/PKG (GAUZE/BANDAGES/DRESSINGS) ×2
STRIP CLOSURE SKIN 1/2X4 (GAUZE/BANDAGES/DRESSINGS) ×2 IMPLANT
SUT MNCRL AB 4-0 PS2 18 (SUTURE) ×3 IMPLANT
TAPE CLOTH SURG 4X10 WHT LF (GAUZE/BANDAGES/DRESSINGS) ×2 IMPLANT
TOWEL OR 17X26 10 PK STRL BLUE (TOWEL DISPOSABLE) ×3 IMPLANT
TOWEL OR NON WOVEN STRL DISP B (DISPOSABLE) ×3 IMPLANT
TRAY LAPAROSCOPIC (CUSTOM PROCEDURE TRAY) ×3 IMPLANT
TROCAR BLADELESS OPT 5 100 (ENDOMECHANICALS) ×3 IMPLANT
TROCAR XCEL BLUNT TIP 100MML (ENDOMECHANICALS) ×3 IMPLANT
TROCAR XCEL NON-BLD 11X100MML (ENDOMECHANICALS) IMPLANT

## 2014-12-30 NOTE — Anesthesia Procedure Notes (Signed)
Procedure Name: Intubation Date/Time: 12/30/2014 11:17 AM Performed by: Lissa Morales Pre-anesthesia Checklist: Patient identified, Emergency Drugs available, Suction available and Patient being monitored Patient Re-evaluated:Patient Re-evaluated prior to inductionOxygen Delivery Method: Circle System Utilized Preoxygenation: Pre-oxygenation with 100% oxygen Intubation Type: IV induction Ventilation: Mask ventilation without difficulty Laryngoscope Size: Miller and 2 Grade View: Grade I Tube type: Oral Tube size: 7.5 mm Number of attempts: 1 Airway Equipment and Method: Stylet and Oral airway Placement Confirmation: ETT inserted through vocal cords under direct vision,  positive ETCO2 and breath sounds checked- equal and bilateral Secured at: 21 cm Tube secured with: Tape Dental Injury: Teeth and Oropharynx as per pre-operative assessment

## 2014-12-30 NOTE — Op Note (Signed)
Operative Note-  Preoperative diagnosis:  Acute cholecystitis  Postoperative diagnosis:  Same  Procedure: Laparoscopic cholecystectomy with cholangiogram.  Surgeon: Jackolyn Confer, M.D.  Asst.:  Adonis Housekeeper, M.D.  Anesthesia: General  Indication:   This is a 54 year old female with intermittent biliary colic type pain over the past 2 weeks who had increased severity of the pain and presented to the ED.  Clinical and radiographic findings were consistent with acute cholecystitis and she present for urgent cholecystectomy.  Technique: She was brought to the operating room, placed supine on the operating table, and a general anesthetic was administered. The abdominal wall was then sterilely prepped and draped. Local anesthetic (Marcaine) was infiltrated in the subumbilical region. A  small subumbilical incision was made through the skin, subcutaneous tissue, fascia, and peritoneum, at the site of a scar,  entering the peritoneal cavity under direct vision. A pursestring suture of 0 Vicryl was placed around the edges of the fascia. A Hassan trocar was introduced into the peritoneal cavity and a pneumoperitoneum was created by insufflation of carbon dioxide gas. The laparoscope was introduced into the trocar and no underlying bleeding or organ injury was noted. He/She was then placed in the reverse Trendelenburg position with the right side tilted slightly up.  Three 5 mm trocars were then placed into the abdominal cavity under laparoscopic vision. One in the epigastric area, and 2 in the right upper quadrant area. The gallbladder was visualized; it was acutely edematous with mild inflammatory changes. The fundus was grasped and retracted toward the right shoulder.  Adhesions between the omentum and gallbladder as well as duodenum and gallbladder were separated.  The infundibulum was mobilized with dissection close to the gallbladder and retracted laterally. The cystic duct was identified and a window  was created around it. The anterior branch of the cystic artery was also identified and a window was created around it.  It was clipped and divided.  The critical view was achieved. A clip was placed at the neck of the gallbladder. A small incision was made in the cystic duct. A cholangiocatheter was introduced through the anterior abdominal wall and placed in the cystic duct. A intraoperative cholangiogram was then performed.  Under real-time fluoroscopy, dilute contrast was injected into the cystic duct.  The common hepatic duct, the right and left hepatic ducts, and the common duct were all visualized. Contrast drained into the duodenum without obvious evidence of any obstructing ductal lesion. The final report is pending the Radiologist's interpretation.  The cholangiocatheter was removed, the cystic duct was clipped 3 times on the biliary side, and then the cystic duct was divided sharply. No bile leak was noted from the cystic duct stump.  The posterior branch of the cystic artery was then identified, isolated, clipped and divided. Following this the gallbladder was dissected free from the liver using electrocautery. The gallbladder was then placed in a retrieval bag and removed from the abdominal cavity through the subumbilical incision.  The gallbladder fossa was inspected, irrigated, and bleeding was controlled with electrocautery. Inspection showed that hemostasis was adequate and there was no evidence of bile leak.  The irrigation fluid was evacuated as much as possible.  The subumbilical trocar was removed and the fascial defect was closed by tightening and tying down the pursestring suture under laparoscopic vision.  The remaining trocars were removed and the pneumoperitoneum was released. The skin incisions were closed with 4-0 Monocryl subcuticular stitches. Steri-Strips and sterile dressings were applied.  The procedure was  well-tolerated without any apparent complications. She was  taken to the recovery room in satisfactory condition.

## 2014-12-30 NOTE — Anesthesia Postprocedure Evaluation (Signed)
  Anesthesia Post-op Note  Patient: Kimberly Ferguson  Procedure(s) Performed: Procedure(s): LAPAROSCOPIC CHOLECYSTECTOMY WITH INTRAOPERATIVE CHOLANGIOGRAM (N/A)  Patient Location: PACU  Anesthesia Type:General  Level of Consciousness: awake and alert   Airway and Oxygen Therapy: Patient Spontanous Breathing  Post-op Pain: none  Post-op Assessment: Post-op Vital signs reviewed, Patient's Cardiovascular Status Stable and Respiratory Function Stable  Post-op Vital Signs: Reviewed  Filed Vitals:   12/30/14 1330  BP: 133/76  Pulse: 53  Temp:   Resp: 13    Complications: No apparent anesthesia complications

## 2014-12-30 NOTE — Transfer of Care (Signed)
Immediate Anesthesia Transfer of Care Note  Patient: Kimberly Ferguson  Procedure(s) Performed: Procedure(s): LAPAROSCOPIC CHOLECYSTECTOMY WITH INTRAOPERATIVE CHOLANGIOGRAM (N/A)  Patient Location: PACU  Anesthesia Type:General  Level of Consciousness: awake, alert , oriented and patient cooperative  Airway & Oxygen Therapy: Patient Spontanous Breathing and Patient connected to face mask oxygen  Post-op Assessment: Report given to RN, Post -op Vital signs reviewed and stable and Patient moving all extremities X 4  Post vital signs: stable  Last Vitals:  Filed Vitals:   12/30/14 0912  BP: 171/97  Pulse: 62  Temp: 37 C  Resp: 18    Complications: No apparent anesthesia complications
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
## 2014-12-30 NOTE — Anesthesia Preprocedure Evaluation (Addendum)
Anesthesia Evaluation  Patient identified by MRN, date of birth, ID band Patient awake    Reviewed: Allergy & Precautions, NPO status , Patient's Chart, lab work & pertinent test results  History of Anesthesia Complications Negative for: history of anesthetic complications  Airway Mallampati: III  TM Distance: >3 FB Neck ROM: Full    Dental no notable dental hx. (+) Dental Advisory Given, Edentulous Upper, Edentulous Lower   Pulmonary neg pulmonary ROS,  breath sounds clear to auscultation  Pulmonary exam normal       Cardiovascular hypertension, Pt. on medications Rhythm:Regular Rate:Normal     Neuro/Psych negative neurological ROS  negative psych ROS   GI/Hepatic negative GI ROS, Neg liver ROS,   Endo/Other  Hypothyroidism obesity  Renal/GU negative Renal ROS  negative genitourinary   Musculoskeletal negative musculoskeletal ROS (+)   Abdominal   Peds negative pediatric ROS (+)  Hematology negative hematology ROS (+)   Anesthesia Other Findings   Reproductive/Obstetrics negative OB ROS                            Anesthesia Physical Anesthesia Plan  ASA: II  Anesthesia Plan: General   Post-op Pain Management:    Induction: Intravenous, Rapid sequence and Cricoid pressure planned  Airway Management Planned: Oral ETT  Additional Equipment:   Intra-op Plan:   Post-operative Plan: Extubation in OR  Informed Consent: I have reviewed the patients History and Physical, chart, labs and discussed the procedure including the risks, benefits and alternatives for the proposed anesthesia with the patient or authorized representative who has indicated his/her understanding and acceptance.   Dental advisory given  Plan Discussed with: CRNA  Anesthesia Plan Comments:         Anesthesia Quick Evaluation
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
## 2014-12-31 ENCOUNTER — Encounter (HOSPITAL_COMMUNITY): Payer: Self-pay | Admitting: General Surgery

## 2014-12-31 LAB — BASIC METABOLIC PANEL
Anion gap: 9 (ref 5–15)
BUN: 7 mg/dL (ref 6–23)
CHLORIDE: 107 mmol/L (ref 96–112)
CO2: 25 mmol/L (ref 19–32)
Calcium: 9 mg/dL (ref 8.4–10.5)
Creatinine, Ser: 1.18 mg/dL — ABNORMAL HIGH (ref 0.50–1.10)
GFR calc Af Amer: 59 mL/min — ABNORMAL LOW (ref >90)
GFR calc non Af Amer: 51 mL/min — ABNORMAL LOW (ref >90)
Glucose, Bld: 119 mg/dL — ABNORMAL HIGH (ref 70–99)
Potassium: 3.9 mmol/L (ref 3.5–5.1)
Sodium: 141 mmol/L (ref 135–145)

## 2014-12-31 MED ORDER — PROMETHAZINE HCL 25 MG PO TABS: 25.0000 mg | ORAL_TABLET | Freq: Four times a day (QID) | ORAL | Status: DC | PRN

## 2014-12-31 MED ORDER — ACETAMINOPHEN 325 MG PO TABS: 650.0000 mg | ORAL_TABLET | Freq: Four times a day (QID) | ORAL | Status: DC | PRN

## 2014-12-31 MED ORDER — TRAMADOL HCL 50 MG PO TABS: 50.0000 mg | ORAL_TABLET | Freq: Four times a day (QID) | ORAL | Status: DC | PRN
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
## 2014-12-31 NOTE — Progress Notes (Signed)
UR completed 

## 2014-12-31 NOTE — Discharge Instructions (Signed)

## 2015-01-30 ENCOUNTER — Emergency Department (HOSPITAL_COMMUNITY): Payer: Managed Care, Other (non HMO)

## 2015-01-30 ENCOUNTER — Emergency Department (HOSPITAL_COMMUNITY)
Admission: EM | Admit: 2015-01-30 | Discharge: 2015-01-30 | Disposition: A | Discharge status: Home / Self Care | Payer: Managed Care, Other (non HMO) | Attending: Emergency Medicine | Admitting: Emergency Medicine

## 2015-01-30 ENCOUNTER — Encounter (HOSPITAL_COMMUNITY): Payer: Self-pay | Admitting: *Deleted

## 2015-01-30 DIAGNOSIS — I10 Essential (primary) hypertension: Secondary | ICD-10-CM | POA: Insufficient documentation

## 2015-01-30 DIAGNOSIS — Z880 Allergy status to penicillin: Secondary | ICD-10-CM | POA: Diagnosis not present

## 2015-01-30 DIAGNOSIS — Z79899 Other long term (current) drug therapy: Secondary | ICD-10-CM | POA: Insufficient documentation

## 2015-01-30 DIAGNOSIS — R51 Headache: Secondary | ICD-10-CM | POA: Diagnosis not present

## 2015-01-30 DIAGNOSIS — Z91040 Latex allergy status: Secondary | ICD-10-CM | POA: Diagnosis not present

## 2015-01-30 DIAGNOSIS — E039 Hypothyroidism, unspecified: Secondary | ICD-10-CM | POA: Diagnosis not present

## 2015-01-30 DIAGNOSIS — Z7951 Long term (current) use of inhaled steroids: Secondary | ICD-10-CM | POA: Insufficient documentation

## 2015-01-30 DIAGNOSIS — R0981 Nasal congestion: Secondary | ICD-10-CM | POA: Diagnosis not present

## 2015-01-30 DIAGNOSIS — R519 Headache, unspecified: Secondary | ICD-10-CM

## 2015-01-30 MED ORDER — SODIUM CHLORIDE 0.9 % IV BOLUS (SEPSIS): 1000.0000 mL | Freq: Once | INTRAVENOUS | Status: DC

## 2015-01-30 MED ORDER — DIPHENHYDRAMINE HCL 50 MG/ML IJ SOLN: 25.0000 mg | Freq: Once | INTRAMUSCULAR | Status: DC

## 2015-01-30 MED ORDER — TRAMADOL HCL 50 MG PO TABS: 50.0000 mg | ORAL_TABLET | Freq: Four times a day (QID) | ORAL | Status: DC | PRN

## 2015-01-30 MED ORDER — TRAMADOL HCL 50 MG PO TABS
50.0000 mg | ORAL_TABLET | Freq: Once | ORAL | Status: AC
  Administered 2015-01-30: 50 mg via ORAL
  Filled 2015-01-30: qty 1

## 2015-01-30 MED ORDER — DEXAMETHASONE SODIUM PHOSPHATE 10 MG/ML IJ SOLN: 10.0000 mg | Freq: Once | INTRAMUSCULAR | Status: DC

## 2015-01-30 MED ORDER — METOCLOPRAMIDE HCL 5 MG/ML IJ SOLN: 10.0000 mg | Freq: Once | INTRAMUSCULAR | Status: DC

## 2015-01-30 MED ORDER — KETOROLAC TROMETHAMINE 30 MG/ML IJ SOLN: 30.0000 mg | Freq: Once | INTRAMUSCULAR | Status: DC

## 2015-01-30 NOTE — ED Notes (Signed)
Pt reports headache since Tuesday with nausea. Pt also reports some photophobia.

## 2015-01-30 NOTE — Discharge Instructions (Signed)
Tramadol as prescribed as needed for pain.  Follow-up with your primary Dr. if not improving in the next week.   General Headache Without Cause A headache is pain or discomfort felt around the head or neck area. The specific cause of a headache may not be found. There are many causes and types of headaches. A few common ones are:  Tension headaches.  Migraine headaches.  Cluster headaches.  Chronic daily headaches. HOME CARE INSTRUCTIONS   Keep all follow-up appointments with your caregiver or any specialist referral.  Only take over-the-counter or prescription medicines for pain or discomfort as directed by your caregiver.  Lie down in a dark, quiet room when you have a headache.  Keep a headache journal to find out what may trigger your migraine headaches. For example, write down:  What you eat and drink.  How much sleep you get.  Any change to your diet or medicines.  Try massage or other relaxation techniques.  Put ice packs or heat on the head and neck. Use these 3 to 4 times per day for 15 to 20 minutes each time, or as needed.  Limit stress.  Sit up straight, and do not tense your muscles.  Quit smoking if you smoke.  Limit alcohol use.  Decrease the amount of caffeine you drink, or stop drinking caffeine.  Eat and sleep on a regular schedule.  Get 7 to 9 hours of sleep, or as recommended by your caregiver.  Keep lights dim if bright lights bother you and make your headaches worse. SEEK MEDICAL CARE IF:   You have problems with the medicines you were prescribed.  Your medicines are not working.  You have a change from the usual headache.  You have nausea or vomiting. SEEK IMMEDIATE MEDICAL CARE IF:   Your headache becomes severe.  You have a fever.  You have a stiff neck.  You have loss of vision.  You have muscular weakness or loss of muscle control.  You start losing your balance or have trouble walking.  You feel faint or pass  out.  You have severe symptoms that are different from your first symptoms. MAKE SURE YOU:   Understand these instructions.  Will watch your condition.  Will get help right away if you are not doing well or get worse. Document Released: 08/22/2005 Document Revised: 11/14/2011 Document Reviewed: 09/07/2011 [facility] Patient Information 2015 [place], [place]. This information is not intended to replace advice given to you by your health care provider. Make sure you discuss any questions you have with your health care provider.

## 2015-01-30 NOTE — ED Provider Notes (Signed)
CSN: [id number]     Arrival date & time 01/30/15  1931 History   First MD Initiated Contact with Patient 01/30/15 2058     Chief Complaint  Patient presents with  . Facial Pain     (Consider location/radiation/quality/duration/timing/severity/associated sxs/prior Treatment) Patient is a 54 y.o. female presenting with headaches. The history is provided by the patient.  Headache Pain location:  Frontal Quality:  Dull Radiates to:  Does not radiate Onset quality:  Gradual Duration:  5 days Timing:  Constant Progression:  Worsening Chronicity:  New Similar to prior headaches: no   Context: bright light   Relieved by:  Nothing Worsened by:  Nothing Ineffective treatments:  None tried Associated symptoms: congestion   Associated symptoms: no fever     Past Medical History  Diagnosis Date  . Hypertension   . Hypothyroidism    Past Surgical History  Procedure Laterality Date  . Thyroidectomy  2012  . Cesarean section  1990  . Vaginal hysterectomy  2012  . Abdominal hysterectomy    . Cholecystectomy N/A 12/30/2014    Procedure: LAPAROSCOPIC CHOLECYSTECTOMY WITH INTRAOPERATIVE CHOLANGIOGRAM;  Surgeon: Jackolyn Confer, MD;  Location: [facility];  Service: General;  Laterality: N/A;   Family History  Problem Relation Age of Onset  . Colon cancer Maternal Grandfather    History  Substance Use Topics  . Smoking status: Never Smoker   . Smokeless tobacco: Never Used  . Alcohol Use: Yes     Comment: occasional   OB History    No data available     Review of Systems  Constitutional: Negative for fever.  HENT: Positive for congestion.   Neurological: Positive for headaches.  All other systems reviewed and are negative.     Allergies  Penicillins; Hydrocodone; Oxycodone; and Latex  Home Medications   Prior to Admission medications   Medication Sig Start Date End Date Taking? Authorizing Provider  albuterol (PROVENTIL HFA;VENTOLIN HFA) 108 (90 BASE) MCG/ACT inhaler  Inhale 1-2 puffs into the lungs every 6 (six) hours as needed for wheezing or shortness of breath.  01/08/15 01/08/16 Yes Historical Provider, MD  cholecalciferol (VITAMIN D) 1000 UNITS tablet Take 1,000 Units by mouth daily.   Yes Historical Provider, MD  cyclobenzaprine (FLEXERIL) 10 MG tablet Take 10 mg by mouth at bedtime as needed for muscle spasms.  01/08/15  Yes Historical Provider, MD  levothyroxine (SYNTHROID, LEVOTHROID) 137 MCG tablet Take 137 mcg by mouth daily before breakfast. 01/08/15 01/08/16 Yes Historical Provider, MD  losartan-hydrochlorothiazide (HYZAAR) 100-12.5 MG per tablet Take 1 tablet by mouth daily. 01/08/15  Yes Historical Provider, MD  Nebivolol HCl 20 MG TABS Take 20 mg by mouth daily. 01/08/15  Yes Historical Provider, MD  ondansetron (ZOFRAN-ODT) 4 MG disintegrating tablet Take 4 mg by mouth every 8 (eight) hours as needed for nausea or vomiting.   Yes Historical Provider, MD  scopolamine (TRANSDERM-SCOP) 1 MG/3DAYS Place 1.5 mg onto the skin every 3 (three) days. 01/07/15 01/07/16 Yes Historical Provider, MD  acetaminophen (TYLENOL) 325 MG tablet Take 2 tablets (650 mg total) by mouth every 6 (six) hours as needed for mild pain (or Temp > 100). Patient not taking: Reported on 01/30/2015 12/31/14   Emina Riebock, NP  mometasone (NASONEX) 50 MCG/ACT nasal spray Place 2 sprays into the nose daily. Patient not taking: Reported on 12/16/2014 02/23/13   Julianne Rice, MD  oxymetazoline [facility] NASAL SPRAY) 0.05 % nasal spray Place 2 sprays into the nose 2 (two) times daily. Patient not  taking: Reported on 12/16/2014 02/23/13   Julianne Rice, MD  promethazine (PHENERGAN) 25 MG tablet Take 1 tablet (25 mg total) by mouth every 6 (six) hours as needed for nausea. Patient not taking: Reported on 01/30/2015 12/31/14   Erby Pian, NP  traMADol (ULTRAM) 50 MG tablet Take 1 tablet (50 mg total) by mouth every 6 (six) hours as needed. Patient not taking: Reported on 01/30/2015 12/31/14   Emina Riebock,  NP   BP 142/87 mmHg  Pulse 86  Temp(Src) 98.7 F (37.1 C)  Resp 20  SpO2 100% Physical Exam  Constitutional: She is oriented to person, place, and time. She appears well-developed and well-nourished. No distress.  HENT:  Head: Normocephalic and atraumatic.  Mouth/Throat: Oropharynx is clear and moist.  There is tenderness to palpation to the bilateral maxillary and frontal sinuses  Eyes: EOM are normal. Pupils are equal, round, and reactive to light.  Neck: Normal range of motion. Neck supple.  Cardiovascular: Normal rate, regular rhythm and normal heart sounds.   No murmur heard. Pulmonary/Chest: Effort normal and breath sounds normal. No respiratory distress. She has no wheezes.  Abdominal: Soft. Bowel sounds are normal. She exhibits no distension. There is no tenderness.  Musculoskeletal: Normal range of motion. She exhibits no edema.  Lymphadenopathy:    She has no cervical adenopathy.  Neurological: She is alert and oriented to person, place, and time.  Skin: Skin is warm and dry. She is not diaphoretic.  Nursing note and vitals reviewed.   ED Course  Procedures (including critical care time) Labs Review Labs Reviewed - No data to display  Imaging Review No results found.   EKG Interpretation None      MDM   Final diagnoses:  None    Patient presents with complaints of headache and facial pain. Her physical examination is unremarkable, neurologic exam is nonfocal, and CT scan of the head is negative for acute process. At this point I feel as though she is appropriate for discharge. She will be given tramadol for her pain and advised to return as needed if her symptoms worsen or change.    Veryl Speak, MD 01/30/15 2218

## 2015-05-25 ENCOUNTER — Emergency Department (HOSPITAL_COMMUNITY)
Admission: EM | Admit: 2015-05-25 | Discharge: 2015-05-25 | Disposition: A | Discharge status: Home / Self Care | Payer: Managed Care, Other (non HMO) | Attending: Emergency Medicine | Admitting: Emergency Medicine

## 2015-05-25 ENCOUNTER — Encounter (HOSPITAL_COMMUNITY): Payer: Self-pay

## 2015-05-25 DIAGNOSIS — X158XXA Contact with other hot household appliances, initial encounter: Secondary | ICD-10-CM | POA: Insufficient documentation

## 2015-05-25 DIAGNOSIS — Y9389 Activity, other specified: Secondary | ICD-10-CM | POA: Insufficient documentation

## 2015-05-25 DIAGNOSIS — Z91040 Latex allergy status: Secondary | ICD-10-CM | POA: Insufficient documentation

## 2015-05-25 DIAGNOSIS — Y9289 Other specified places as the place of occurrence of the external cause: Secondary | ICD-10-CM | POA: Insufficient documentation

## 2015-05-25 DIAGNOSIS — I10 Essential (primary) hypertension: Secondary | ICD-10-CM | POA: Insufficient documentation

## 2015-05-25 DIAGNOSIS — E039 Hypothyroidism, unspecified: Secondary | ICD-10-CM | POA: Insufficient documentation

## 2015-05-25 DIAGNOSIS — Z880 Allergy status to penicillin: Secondary | ICD-10-CM | POA: Insufficient documentation

## 2015-05-25 DIAGNOSIS — T23222A Burn of second degree of single left finger (nail) except thumb, initial encounter: Secondary | ICD-10-CM | POA: Insufficient documentation

## 2015-05-25 DIAGNOSIS — Y998 Other external cause status: Secondary | ICD-10-CM | POA: Insufficient documentation

## 2015-05-25 DIAGNOSIS — Z79899 Other long term (current) drug therapy: Secondary | ICD-10-CM | POA: Insufficient documentation

## 2015-05-25 MED ORDER — BACITRACIN ZINC 500 UNIT/GM EX OINT: 1.0000 "application " | TOPICAL_OINTMENT | Freq: Two times a day (BID) | CUTANEOUS | Status: DC

## 2015-05-25 MED ORDER — BACITRACIN ZINC 500 UNIT/GM EX OINT
1.0000 "application " | TOPICAL_OINTMENT | Freq: Two times a day (BID) | CUTANEOUS | Status: DC
  Administered 2015-05-25: 1 via TOPICAL
  Filled 2015-05-25: qty 1.8

## 2015-05-25 MED ORDER — NAPROXEN 250 MG PO TABS: 250.0000 mg | ORAL_TABLET | Freq: Two times a day (BID) | ORAL | Status: DC

## 2015-05-25 NOTE — ED Provider Notes (Signed)
CSN: [id number]     Arrival date & time 05/25/15  2563 History   First MD Initiated Contact with Patient 05/25/15 1039     Chief Complaint  Patient presents with  . Hand Burn   Kimberly Ferguson is a 54 y.o. female who is right hand dominant who presents to the ED complaining of a burn to her left index finger sustained two days ago. She reports she was grilling ribs when a piece of meet landed on her left index finger causing a burn two days ago. She reports 6 out of 10 pain to her left index finger currently. She reports taking ibuprofen with some relief. She denies drainage from her finger. She denies fevers or streaking redness. She reports she has been able to move her finger without difficulty.  (Consider location/radiation/quality/duration/timing/severity/associated sxs/prior Treatment) HPI  Past Medical History  Diagnosis Date  . Hypertension   . Hypothyroidism    Past Surgical History  Procedure Laterality Date  . Thyroidectomy  2012  . Cesarean section  1990  . Vaginal hysterectomy  2012  . Abdominal hysterectomy    . Cholecystectomy N/A 12/30/2014    Procedure: LAPAROSCOPIC CHOLECYSTECTOMY WITH INTRAOPERATIVE CHOLANGIOGRAM;  Surgeon: Jackolyn Confer, MD;  Location: [facility];  Service: General;  Laterality: N/A;   Family History  Problem Relation Age of Onset  . Colon cancer Maternal Grandfather    Social History  Substance Use Topics  . Smoking status: Never Smoker   . Smokeless tobacco: Never Used  . Alcohol Use: Yes     Comment: occasional   OB History    No data available     Review of Systems  Constitutional: Negative for fever.  Musculoskeletal: Negative for joint swelling and arthralgias.  Skin: Positive for color change and wound.  Neurological: Negative for weakness and numbness.      Allergies  Penicillins; Hydrocodone; Oxycodone; and Latex  Home Medications   Prior to Admission medications   Medication Sig Start Date End Date Taking? Authorizing  Provider  acetaminophen (TYLENOL) 325 MG tablet Take 2 tablets (650 mg total) by mouth every 6 (six) hours as needed for mild pain (or Temp > 100). Patient not taking: Reported on 01/30/2015 12/31/14   Erby Pian, NP  albuterol (PROVENTIL HFA;VENTOLIN HFA) 108 (90 BASE) MCG/ACT inhaler Inhale 1-2 puffs into the lungs every 6 (six) hours as needed for wheezing or shortness of breath.  01/08/15 01/08/16  Historical Provider, MD  bacitracin ointment Apply 1 application topically 2 (two) times daily. 05/25/15   Waynetta Pean, PA-C  cholecalciferol (VITAMIN D) 1000 UNITS tablet Take 1,000 Units by mouth daily.    Historical Provider, MD  cyclobenzaprine (FLEXERIL) 10 MG tablet Take 10 mg by mouth at bedtime as needed for muscle spasms.  01/08/15   Historical Provider, MD  levothyroxine (SYNTHROID, LEVOTHROID) 137 MCG tablet Take 137 mcg by mouth daily before breakfast. 01/08/15 01/08/16  Historical Provider, MD  losartan-hydrochlorothiazide (HYZAAR) 100-12.5 MG per tablet Take 1 tablet by mouth daily. 01/08/15   Historical Provider, MD  mometasone (NASONEX) 50 MCG/ACT nasal spray Place 2 sprays into the nose daily. Patient not taking: Reported on 12/16/2014 02/23/13   Julianne Rice, MD  naproxen (NAPROSYN) 250 MG tablet Take 1 tablet (250 mg total) by mouth 2 (two) times daily with a meal. 05/25/15   Waynetta Pean, PA-C  Nebivolol HCl 20 MG TABS Take 20 mg by mouth daily. 01/08/15   Historical Provider, MD  ondansetron (ZOFRAN-ODT) 4 MG disintegrating tablet  Take 4 mg by mouth every 8 (eight) hours as needed for nausea or vomiting.    Historical Provider, MD  oxymetazoline (AFRIN NASAL SPRAY) 0.05 % nasal spray Place 2 sprays into the nose 2 (two) times daily. Patient not taking: Reported on 12/16/2014 02/23/13   Julianne Rice, MD  promethazine (PHENERGAN) 25 MG tablet Take 1 tablet (25 mg total) by mouth every 6 (six) hours as needed for nausea. Patient not taking: Reported on 01/30/2015 12/31/14   Erby Pian, NP   scopolamine (TRANSDERM-SCOP) 1 MG/3DAYS Place 1.5 mg onto the skin every 3 (three) days. 01/07/15 01/07/16  Historical Provider, MD  traMADol (ULTRAM) 50 MG tablet Take 1 tablet (50 mg total) by mouth every 6 (six) hours as needed. 01/30/15   Veryl Speak, MD   BP 116/81 mmHg  Pulse 66  Temp(Src) 98.7 F (37.1 C) (Oral)  Resp 14  SpO2 100% Physical Exam  Constitutional: She appears well-developed and well-nourished. No distress.  Nontoxic appearing.  HENT:  Head: Normocephalic and atraumatic.  Eyes: Right eye exhibits no discharge. Left eye exhibits no discharge.  Cardiovascular: Normal rate and intact distal pulses.   Bilateral radial pulses are intact. Good capillary refill to left distal fingertips.   Pulmonary/Chest: Effort normal. No respiratory distress.  Musculoskeletal: Normal range of motion. She exhibits tenderness.  Tenderness over left index finger pad with a 0.5 cm area of intact fluid filled blister. Finger is clean and dry. No drainage. No surrounding or streaking erythema. She is able to move all of her left digits without difficulty. Compartments are soft.  No wrist or hand tenderness.   Neurological: She is alert. Coordination normal.  Sensation is intact to her bilateral distal fingertips.   Skin: No rash noted. She is not diaphoretic.  Psychiatric: She has a normal mood and affect. Her behavior is normal.  Nursing note and vitals reviewed.   ED Course  Procedures (including critical care time) Labs Review Labs Reviewed - No data to display  Imaging Review No results found.    EKG Interpretation None      Filed Vitals:   05/25/15 1018  BP: 116/81  Pulse: 66  Temp: 98.7 F (37.1 C)  TempSrc: Oral  Resp: 14  SpO2: 100%     MDM   Meds given in ED:  Medications  bacitracin ointment 1 application (not administered)    New Prescriptions   BACITRACIN OINTMENT    Apply 1 application topically 2 (two) times daily.   NAPROXEN (NAPROSYN) 250 MG TABLET     Take 1 tablet (250 mg total) by mouth 2 (two) times daily with a meal.    Final diagnoses:  Burn of finger, left, second degree, initial encounter   This is a 54 y.o. female who is right hand dominant who presents to the ED complaining of a burn to her left index finger sustained two days ago. She reports she was grilling ribs when a piece of meet landed on her left index finger causing a burn two days ago. On exam the patient has tenderness over her left index finger pad with the 0.5 cm area of fluid-filled blister is intact. No surrounding streaking erythema. Patient has good capillary refill to her left distal fingertips. Bilateral radial pulses are intact. Patient is able to move her finger without difficulty and all compartments feel soft. No sign of infection. Encourage the patient to keep area clean and dry and not to break the blister. I provided extensive education on wound  care and use of bacitracin ointment. We'll have the patient use naproxen for pain control. I provided extensive education on return precautions for her burn and encouraged use of bacitracin ointment. I advised the patient to follow-up with their primary care provider this week. I advised the patient to return to the emergency department with new or worsening symptoms or new concerns. The patient verbalized understanding and agreement with plan.      Waynetta Pean, PA-C 05/25/15 1121  Sherwood Gambler, MD 05/26/15 [phone]

## 2015-05-25 NOTE — Discharge Instructions (Signed)
Burn Care °Your skin is a natural barrier to infection. It is the largest organ of your body. Burns damage this natural protection. To help prevent infection, it is very important to follow your caregiver's instructions in the care of your burn. °Burns are classified as: °· First degree. There is only redness of the skin (erythema). No scarring is expected. °· Second degree. There is blistering of the skin. Scarring may occur with deeper burns. °· Third degree. All layers of the skin are injured, and scarring is expected. °HOME CARE INSTRUCTIONS  °· Wash your hands well before changing your bandage. °· Change your bandage as often as directed by your caregiver. °· Remove the old bandage. If the bandage sticks, you may soak it off with cool, clean water. °· Cleanse the burn thoroughly but gently with mild soap and water. °· Pat the area dry with a clean, dry cloth. °· Apply a thin layer of antibacterial cream to the burn. °· Apply a clean bandage as instructed by your caregiver. °· Keep the bandage as clean and dry as possible. °· Elevate the affected area for the first 24 hours, then as instructed by your caregiver. °· Only take over-the-counter or prescription medicines for pain, discomfort, or fever as directed by your caregiver. °SEEK IMMEDIATE MEDICAL CARE IF:  °· You develop excessive pain. °· You develop redness, tenderness, swelling, or red streaks near the burn. °· The burned area develops yellowish-white fluid (pus) or a bad smell. °· You have a fever. °MAKE SURE YOU:  °· Understand these instructions. °· Will watch your condition. °· Will get help right away if you are not doing well or get worse. °Document Released: 08/22/2005 Document Revised: 11/14/2011 Document Reviewed: 01/12/2011 °ExitCare® Patient Information ©2015 [employer]. This information is not intended to replace advice given to you by your health care provider. Make sure you discuss any questions you have with your health care  provider. ° ° ° °Dressing Change °A dressing is a material placed over wounds. It keeps the wound clean, dry, and protected from further injury. This provides an environment that favors wound healing.  °BEFORE YOU BEGIN °· Get your supplies together. Things you may need include: °¨ Saline solution. °¨ Flexible gauze dressing. °¨ Medicated cream. °¨ Tape. °¨ Gloves. °¨ Abdominal dressing pads. °¨ Gauze squares. °¨ Plastic bags. °· Take pain medicine 30 minutes before the dressing change if you need it. °· Take a shower before you do the first dressing change of the day. Use plastic wrap or a plastic bag to prevent the dressing from getting wet. °REMOVING YOUR OLD DRESSING  °· Wash your hands with soap and water. Dry your hands with a clean towel. °· Put on your gloves. °· Remove any tape. °· Carefully remove the old dressing. If the dressing sticks, you may dampen it with warm water to loosen it, or follow your caregiver's specific directions. °· Remove any gauze or packing tape that is in your wound. °· Take off your gloves. °· Put the gloves, tape, gauze, or any packing tape into a plastic bag. °CHANGING YOUR DRESSING °· Open the supplies. °· Take the cap off the saline solution. °· Open the gauze package so that the gauze remains on the inside of the package. °· Put on your gloves. °· Clean your wound as told by your caregiver. °· If you have been told to keep your wound dry, follow those instructions. °· Your caregiver may tell you to do one or more of   the following: °¨ Pick up the gauze. Pour the saline solution over the gauze. Squeeze out the extra saline solution. °¨ Put medicated cream or other medicine on your wound if you have been told to do so. °¨ Put the solution soaked gauze only in your wound, not on the skin around it. °¨ Pack your wound loosely or as told by your caregiver. °¨ Put dry gauze on your wound. °¨ Put abdominal dressing pads over the dry gauze if your wet gauze soaks through. °· Tape the  abdominal dressing pads in place so they will not fall off. Do not wrap the tape completely around the affected part (arm, leg, abdomen). °· Wrap the dressing pads with a flexible gauze dressing to secure it in place. °· Take off your gloves. Put them in the plastic bag with the old dressing. Tie the bag shut and throw it away. °· Keep the dressing clean and dry until your next dressing change. °· Wash your hands. °SEEK MEDICAL CARE IF: °· Your skin around the wound looks red. °· Your wound feels more tender or sore. °· You see pus in the wound. °· Your wound smells bad. °· You have a fever. °· Your skin around the wound has a rash that itches and burns. °· You see black or yellow skin in your wound that was not there before. °· You feel nauseous, throw up, and feel very tired. °Document Released: 09/29/2004 Document Revised: 11/14/2011 Document Reviewed: 07/04/2011 °ExitCare® Patient Information ©2015 [employer]. This information is not intended to replace advice given to you by your health care provider. Make sure you discuss any questions you have with your health care provider. ° °
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
## 2015-05-25 NOTE — ED Notes (Signed)
Work note given

## 2015-09-25 ENCOUNTER — Other Ambulatory Visit: Payer: Self-pay | Admitting: Family Medicine

## 2015-09-25 DIAGNOSIS — Z1231 Encounter for screening mammogram for malignant neoplasm of breast: Secondary | ICD-10-CM

## 2015-10-07 ENCOUNTER — Inpatient Hospital Stay (HOSPITAL_COMMUNITY)
Admission: AD | Admit: 2015-10-07 | Discharge: 2015-10-07 | Disposition: A | Discharge status: Home / Self Care | Payer: BLUE CROSS/BLUE SHIELD | Source: Ambulatory Visit | Attending: Obstetrics & Gynecology | Admitting: Obstetrics & Gynecology

## 2015-10-07 ENCOUNTER — Encounter (HOSPITAL_COMMUNITY): Payer: Self-pay | Admitting: *Deleted

## 2015-10-07 DIAGNOSIS — I10 Essential (primary) hypertension: Secondary | ICD-10-CM | POA: Diagnosis not present

## 2015-10-07 DIAGNOSIS — Z90710 Acquired absence of both cervix and uterus: Secondary | ICD-10-CM | POA: Insufficient documentation

## 2015-10-07 DIAGNOSIS — B373 Candidiasis of vulva and vagina: Secondary | ICD-10-CM | POA: Diagnosis not present

## 2015-10-07 DIAGNOSIS — M545 Low back pain: Secondary | ICD-10-CM | POA: Insufficient documentation

## 2015-10-07 DIAGNOSIS — B3731 Acute candidiasis of vulva and vagina: Secondary | ICD-10-CM

## 2015-10-07 LAB — WET PREP, GENITAL
Clue Cells Wet Prep HPF POC: NONE SEEN
Sperm: NONE SEEN
Trich, Wet Prep: NONE SEEN
Yeast Wet Prep HPF POC: NONE SEEN

## 2015-10-07 LAB — URINALYSIS, ROUTINE W REFLEX MICROSCOPIC
Bilirubin Urine: NEGATIVE
GLUCOSE, UA: NEGATIVE mg/dL
HGB URINE DIPSTICK: NEGATIVE
Ketones, ur: NEGATIVE mg/dL
Leukocytes, UA: NEGATIVE
Nitrite: NEGATIVE
PH: 5.5 (ref 5.0–8.0)
Protein, ur: NEGATIVE mg/dL
SPECIFIC GRAVITY, URINE: 1.015 (ref 1.005–1.030)

## 2015-10-07 MED ORDER — FLUCONAZOLE 150 MG PO TABS
150.0000 mg | ORAL_TABLET | Freq: Once | ORAL | Status: AC
  Administered 2015-10-07: 150 mg via ORAL
  Filled 2015-10-07: qty 1

## 2015-10-07 MED ORDER — FLUCONAZOLE 150 MG PO TABS: 150.0000 mg | ORAL_TABLET | Freq: Once | ORAL | Status: DC

## 2015-10-07 NOTE — MAU Note (Signed)
Pt presents with vaginal burning and itching, lower back pain, burning with urination. Symptoms for 2 days.

## 2015-10-07 NOTE — Discharge Instructions (Signed)

## 2015-10-07 NOTE — MAU Provider Note (Signed)
History     CSN: [id number]  Arrival date and time: 10/07/15 0630   First Provider Initiated Contact with Patient 10/07/15 0549      No chief complaint on file.  HPI Kimberly Ferguson is a 55 y.o. female who presents to MAU today with complaint of vaginal itching, dysuria and low back pain. The patient has had a hysterectomy. She denies vaginal bleeding. She states minimal discharge. She has had the vaginal itching and burning x 2 days. She has tried Monistat OTC with minimal relief. She also states low back pain, worse with movement. She denies flank pain or fever. She has taken Motrin with minimal relief.    Past Medical History  Diagnosis Date  . Hypertension   . Hypothyroidism     Past Surgical History  Procedure Laterality Date  . Thyroidectomy  2012  . Cesarean section  1990  . Vaginal hysterectomy  2012  . Abdominal hysterectomy    . Cholecystectomy N/A 12/30/2014    Procedure: LAPAROSCOPIC CHOLECYSTECTOMY WITH INTRAOPERATIVE CHOLANGIOGRAM;  Surgeon: Jackolyn Confer, MD;  Location: [facility];  Service: General;  Laterality: N/A;    Family History  Problem Relation Age of Onset  . Colon cancer Maternal Grandfather     Social History  Substance Use Topics  . Smoking status: Never Smoker   . Smokeless tobacco: Never Used  . Alcohol Use: Yes     Comment: occasional    Allergies:  Allergies  Allergen Reactions  . Penicillins Anaphylaxis  . Hydrocodone Nausea And Vomiting  . Oxycodone Nausea And Vomiting  . Latex Swelling and Rash    Prescriptions prior to admission  Medication Sig Dispense Refill Last Dose  . albuterol (PROVENTIL HFA;VENTOLIN HFA) 108 (90 BASE) MCG/ACT inhaler Inhale 1-2 puffs into the lungs every 6 (six) hours as needed for wheezing or shortness of breath.    10/06/2015 at Unknown time  . cholecalciferol (VITAMIN D) 1000 UNITS tablet Take 1,000 Units by mouth daily.   10/06/2015 at Unknown time  . levothyroxine (SYNTHROID, LEVOTHROID) 137 MCG  tablet Take 137 mcg by mouth daily before breakfast.   10/06/2015 at Unknown time  . losartan-hydrochlorothiazide (HYZAAR) 100-12.5 MG per tablet Take 1 tablet by mouth daily.   10/06/2015 at Unknown time  . Miconazole Nitrate Applicator (MONISTAT 3 COMBO PACK APP) 200 & 2 MG-% (9GM) KIT Place vaginally.   10/06/2015 at Unknown time  . Nebivolol HCl 20 MG TABS Take 20 mg by mouth daily.   10/06/2015 at Unknown time  . acetaminophen (TYLENOL) 325 MG tablet Take 2 tablets (650 mg total) by mouth every 6 (six) hours as needed for mild pain (or Temp > 100). (Patient not taking: Reported on 01/30/2015)   Not Taking at Unknown time  . bacitracin ointment Apply 1 application topically 2 (two) times daily. 15 g 0   . cyclobenzaprine (FLEXERIL) 10 MG tablet Take 10 mg by mouth at bedtime as needed for muscle spasms.    01/29/2015 at Unknown time  . mometasone (NASONEX) 50 MCG/ACT nasal spray Place 2 sprays into the nose daily. (Patient not taking: Reported on 12/16/2014) 17 g 12 Not Taking at Unknown time  . naproxen (NAPROSYN) 250 MG tablet Take 1 tablet (250 mg total) by mouth 2 (two) times daily with a meal. 30 tablet 0   . ondansetron (ZOFRAN-ODT) 4 MG disintegrating tablet Take 4 mg by mouth every 8 (eight) hours as needed for nausea or vomiting.   01/30/2015 at Unknown time  .  oxymetazoline (AFRIN NASAL SPRAY) 0.05 % nasal spray Place 2 sprays into the nose 2 (two) times daily. (Patient not taking: Reported on 12/16/2014) 30 mL 0 Not Taking at Unknown time  . promethazine (PHENERGAN) 25 MG tablet Take 1 tablet (25 mg total) by mouth every 6 (six) hours as needed for nausea. (Patient not taking: Reported on 01/30/2015) 20 tablet 0 Not Taking at Unknown time  . scopolamine (TRANSDERM-SCOP) 1 MG/3DAYS Place 1.5 mg onto the skin every 3 (three) days.   not yet started  . traMADol (ULTRAM) 50 MG tablet Take 1 tablet (50 mg total) by mouth every 6 (six) hours as needed. 15 tablet 0     Review of Systems   Constitutional: Negative for fever and malaise/fatigue.  Gastrointestinal: Negative for nausea, vomiting, abdominal pain, diarrhea and constipation.  Genitourinary: Positive for dysuria. Negative for urgency, frequency and flank pain.       Neg - vaginal bleeding + discharge, itching  Musculoskeletal: Positive for back pain.   Physical Exam   Blood pressure 121/81, pulse 67, temperature 97.8 F (36.6 C), temperature source Oral, resp. rate 16, SpO2 100 %.  Physical Exam  Nursing note and vitals reviewed. Constitutional: She is oriented to person, place, and time. She appears well-developed and well-nourished. No distress.  HENT:  Head: Normocephalic and atraumatic.  Cardiovascular: Normal rate.   Respiratory: Effort normal.  GI: Soft. She exhibits no distension and no mass. There is no tenderness. There is no rebound and no guarding.  Genitourinary: No bleeding in the vagina. Vaginal discharge (moderate amount of thick, white discharge noted) found.  Cervix is surgically absent. No tenderness to palpation of bimanual exam  Neurological: She is alert and oriented to person, place, and time.  Skin: Skin is warm and dry. No erythema.  Psychiatric: She has a normal mood and affect.    Results for orders placed or performed during the hospital encounter of 10/07/15 (from the past 24 hour(s))  Urinalysis, Routine w reflex microscopic (not at [facility])     Status: None   Collection Time: 10/07/15  5:33 AM  Result Value Ref Range   Color, Urine YELLOW YELLOW   APPearance CLEAR CLEAR   Specific Gravity, Urine 1.015 1.005 - 1.030   pH 5.5 5.0 - 8.0   Glucose, UA NEGATIVE NEGATIVE mg/dL   Hgb urine dipstick NEGATIVE NEGATIVE   Bilirubin Urine NEGATIVE NEGATIVE   Ketones, ur NEGATIVE NEGATIVE mg/dL   Protein, ur NEGATIVE NEGATIVE mg/dL   Nitrite NEGATIVE NEGATIVE   Leukocytes, UA NEGATIVE NEGATIVE  Wet prep, genital     Status: Abnormal   Collection Time: 10/07/15  5:59 AM  Result Value  Ref Range   Yeast Wet Prep HPF POC NONE SEEN NONE SEEN   Trich, Wet Prep NONE SEEN NONE SEEN   Clue Cells Wet Prep HPF POC NONE SEEN NONE SEEN   WBC, Wet Prep HPF POC FEW (A) NONE SEEN   Sperm NONE SEEN     MAU Course  Procedures None  MDM Patient has had hysterectomy UA, wet prep today UA without evidence of infection 150 mg Diflucan given for yeast today   Assessment and Plan  A: Low back pain, MSK in origin Yeast vulvovaginitis, clinical  P: Discharge home Rx for Diflucan given to be taken in 3 days Warning signs for worsening condition discussed Continue Motrin PRN for back pain, follow-up with PCP for chronic issues Patient advised to follow-up with GYN or PCP of choice if symptoms  persist or worsen Patient may return to MAU as needed or if her condition were to change or worsen   Luvenia Redden, PA-C  10/07/2015, 6:14 AM

## 2015-10-09 ENCOUNTER — Ambulatory Visit
Admission: RE | Admit: 2015-10-09 | Discharge: 2015-10-09 | Disposition: A | Discharge status: Home / Self Care | Payer: Managed Care, Other (non HMO) | Source: Ambulatory Visit | Attending: Family Medicine | Admitting: Family Medicine

## 2015-10-09 DIAGNOSIS — Z1231 Encounter for screening mammogram for malignant neoplasm of breast: Secondary | ICD-10-CM

## 2016-01-04 ENCOUNTER — Encounter (HOSPITAL_COMMUNITY): Payer: Self-pay

## 2016-01-04 ENCOUNTER — Emergency Department (HOSPITAL_COMMUNITY): Payer: PRIVATE HEALTH INSURANCE

## 2016-01-04 ENCOUNTER — Emergency Department (HOSPITAL_COMMUNITY)
Admission: EM | Admit: 2016-01-04 | Discharge: 2016-01-04 | Disposition: A | Discharge status: Home / Self Care | Payer: PRIVATE HEALTH INSURANCE | Attending: Emergency Medicine | Admitting: Emergency Medicine

## 2016-01-04 DIAGNOSIS — Z79899 Other long term (current) drug therapy: Secondary | ICD-10-CM | POA: Diagnosis not present

## 2016-01-04 DIAGNOSIS — E039 Hypothyroidism, unspecified: Secondary | ICD-10-CM | POA: Diagnosis not present

## 2016-01-04 DIAGNOSIS — R0789 Other chest pain: Secondary | ICD-10-CM | POA: Insufficient documentation

## 2016-01-04 DIAGNOSIS — I10 Essential (primary) hypertension: Secondary | ICD-10-CM | POA: Insufficient documentation

## 2016-01-04 DIAGNOSIS — R079 Chest pain, unspecified: Secondary | ICD-10-CM | POA: Diagnosis present

## 2016-01-04 DIAGNOSIS — J45909 Unspecified asthma, uncomplicated: Secondary | ICD-10-CM | POA: Insufficient documentation

## 2016-01-04 DIAGNOSIS — Z7951 Long term (current) use of inhaled steroids: Secondary | ICD-10-CM | POA: Insufficient documentation

## 2016-01-04 HISTORY — DX: Unspecified asthma, uncomplicated: J45.909

## 2016-01-04 LAB — I-STAT TROPONIN, ED
TROPONIN I, POC: 0 ng/mL (ref 0.00–0.08)
TROPONIN I, POC: 0 ng/mL (ref 0.00–0.08)

## 2016-01-04 LAB — BASIC METABOLIC PANEL
Anion gap: 9 (ref 5–15)
BUN: 11 mg/dL (ref 6–20)
CO2: 28 mmol/L (ref 22–32)
CREATININE: 0.98 mg/dL (ref 0.44–1.00)
Calcium: 9.3 mg/dL (ref 8.9–10.3)
Chloride: 106 mmol/L (ref 101–111)
GFR calc Af Amer: >60 mL/min (ref >60)
Glucose, Bld: 111 mg/dL — ABNORMAL HIGH (ref 65–99)
POTASSIUM: 3.4 mmol/L — AB (ref 3.5–5.1)
SODIUM: 143 mmol/L (ref 135–145)

## 2016-01-04 LAB — CBC
HEMATOCRIT: 38.9 % (ref 36.0–46.0)
Hemoglobin: 13.3 g/dL (ref 12.0–15.0)
MCH: 28.7 pg (ref 26.0–34.0)
MCHC: 34.2 g/dL (ref 30.0–36.0)
MCV: 84 fL (ref 78.0–100.0)
PLATELETS: 226 10*3/uL (ref 150–400)
RBC: 4.63 MIL/uL (ref 3.87–5.11)
RDW: 13.6 % (ref 11.5–15.5)
WBC: 3.5 10*3/uL — AB (ref 4.0–10.5)

## 2016-01-04 MED ORDER — IBUPROFEN 200 MG PO TABS
400.0000 mg | ORAL_TABLET | Freq: Once | ORAL | Status: AC
  Administered 2016-01-04: 400 mg via ORAL
  Filled 2016-01-04: qty 2

## 2016-01-04 MED ORDER — TRAMADOL HCL 50 MG PO TABS
50.0000 mg | ORAL_TABLET | Freq: Once | ORAL | Status: AC
  Administered 2016-01-04: 50 mg via ORAL
  Filled 2016-01-04: qty 1

## 2016-01-04 MED ORDER — POTASSIUM CHLORIDE CRYS ER 20 MEQ PO TBCR
40.0000 meq | EXTENDED_RELEASE_TABLET | Freq: Once | ORAL | Status: AC
  Administered 2016-01-04: 40 meq via ORAL
  Filled 2016-01-04: qty 2

## 2016-01-04 NOTE — Discharge Instructions (Signed)
It was our pleasure to provide your ER care today - we hope that you feel better.  Take motrin or aleve as need for pain.  Follow up with your doctor in the next few days for recheck - call office to arrange appointment.   If recurrent chest pain, may follow up with cardiology as well - see referral.  Return to ER if worse, new symptoms, fevers, trouble breathing, recurrent/persistent chest pain, other concern.    Nonspecific Chest Pain  Chest pain can be caused by many different conditions. There is always a chance that your pain could be related to something serious, such as a heart attack or a blood clot in your lungs. Chest pain can also be caused by conditions that are not life-threatening. If you have chest pain, it is very important to follow up with your health care provider. CAUSES  Chest pain can be caused by:  Heartburn.  Pneumonia or bronchitis.  Anxiety or stress.  Inflammation around your heart (pericarditis) or lung (pleuritis or pleurisy).  A blood clot in your lung.  A collapsed lung (pneumothorax). It can develop suddenly on its own (spontaneous pneumothorax) or from trauma to the chest.  Shingles infection (varicella-zoster virus).  Heart attack.  Damage to the bones, muscles, and cartilage that make up your chest wall. This can include:  Bruised bones due to injury.  Strained muscles or cartilage due to frequent or repeated coughing or overwork.  Fracture to one or more ribs.  Sore cartilage due to inflammation (costochondritis). RISK FACTORS  Risk factors for chest pain may include:  Activities that increase your risk for trauma or injury to your chest.  Respiratory infections or conditions that cause frequent coughing.  Medical conditions or overeating that can cause heartburn.  Heart disease or family history of heart disease.  Conditions or health behaviors that increase your risk of developing a blood clot.  Having had chicken pox  (varicella zoster). SIGNS AND SYMPTOMS Chest pain can feel like:  Burning or tingling on the surface of your chest or deep in your chest.  Crushing, pressure, aching, or squeezing pain.  Dull or sharp pain that is worse when you move, cough, or take a deep breath.  Pain that is also felt in your back, neck, shoulder, or arm, or pain that spreads to any of these areas. Your chest pain may come and go, or it may stay constant. DIAGNOSIS Lab tests or other studies may be needed to find the cause of your pain. Your health care provider may have you take a test called an ambulatory ECG (electrocardiogram). An ECG records your heartbeat patterns at the time the test is performed. You may also have other tests, such as:  Transthoracic echocardiogram (TTE). During echocardiography, sound waves are used to create a picture of all of the heart structures and to look at how blood flows through your heart.  Transesophageal echocardiogram (TEE).This is a more advanced imaging test that obtains images from inside your body. It allows your health care provider to see your heart in finer detail.  Cardiac monitoring. This allows your health care provider to monitor your heart rate and rhythm in real time.  Holter monitor. This is a portable device that records your heartbeat and can help to diagnose abnormal heartbeats. It allows your health care provider to track your heart activity for several days, if needed.  Stress tests. These can be done through exercise or by taking medicine that makes your heart beat more  quickly.  Blood tests.  Imaging tests. TREATMENT  Your treatment depends on what is causing your chest pain. Treatment may include:  Medicines. These may include:  Acid blockers for heartburn.  Anti-inflammatory medicine.  Pain medicine for inflammatory conditions.  Antibiotic medicine, if an infection is present.  Medicines to dissolve blood clots.  Medicines to treat coronary  artery disease.  Supportive care for conditions that do not require medicines. This may include:  Resting.  Applying heat or cold packs to injured areas.  Limiting activities until pain decreases. HOME CARE INSTRUCTIONS  If you were prescribed an antibiotic medicine, finish it all even if you start to feel better.  Avoid any activities that bring on chest pain.  Do not use any tobacco products, including cigarettes, chewing tobacco, or electronic cigarettes. If you need help quitting, ask your health care provider.  Do not drink alcohol.  Take medicines only as directed by your health care provider.  Keep all follow-up visits as directed by your health care provider. This is important. This includes any further testing if your chest pain does not go away.  If heartburn is the cause for your chest pain, you may be told to keep your head raised (elevated) while sleeping. This reduces the chance that acid will go from your stomach into your esophagus.  Make lifestyle changes as directed by your health care provider. These may include:  Getting regular exercise. Ask your health care provider to suggest some activities that are safe for you.  Eating a heart-healthy diet. A registered dietitian can help you to learn healthy eating options.  Maintaining a healthy weight.  Managing diabetes, if necessary.  Reducing stress. SEEK MEDICAL CARE IF:  Your chest pain does not go away after treatment.  You have a rash with blisters on your chest.  You have a fever. SEEK IMMEDIATE MEDICAL CARE IF:   Your chest pain is worse.  You have an increasing cough, or you cough up blood.  You have severe abdominal pain.  You have severe weakness.  You faint.  You have chills.  You have sudden, unexplained chest discomfort.  You have sudden, unexplained discomfort in your arms, back, neck, or jaw.  You have shortness of breath at any time.  You suddenly start to sweat, or your  skin gets clammy.  You feel nauseous or you vomit.  You suddenly feel light-headed or dizzy.  Your heart begins to beat quickly, or it feels like it is skipping beats. These symptoms may represent a serious problem that is an emergency. Do not wait to see if the symptoms will go away. Get medical help right away. Call your local emergency services (911 in the [place] Do not drive yourself to the hospital.   This information is not intended to replace advice given to you by your health care provider. Make sure you discuss any questions you have with your health care provider.   Document Released: 06/01/2005 Document Revised: 09/12/2014 Document Reviewed: 03/28/2014 Elsevier Interactive Patient Education 2016 [employer]   Chest Wall Pain Chest wall pain is pain in or around the bones and muscles of your chest. Sometimes, an injury causes this pain. Sometimes, the cause may not be known. This pain may take several weeks or longer to get better. HOME CARE INSTRUCTIONS  Pay attention to any changes in your symptoms. Take these actions to help with your pain:   Rest as told by your health care provider.   Avoid activities that cause  pain. These include any activities that use your chest muscles or your abdominal and side muscles to lift heavy items.   If directed, apply ice to the painful area:  Put ice in a plastic bag.  Place a towel between your skin and the bag.  Leave the ice on for 20 minutes, 2-3 times per day.  Take over-the-counter and prescription medicines only as told by your health care provider.  Do not use tobacco products, including cigarettes, chewing tobacco, and e-cigarettes. If you need help quitting, ask your health care provider.  Keep all follow-up visits as told by your health care provider. This is important. SEEK MEDICAL CARE IF:  You have a fever.  Your chest pain becomes worse.  You have new symptoms. SEEK IMMEDIATE MEDICAL CARE IF:  You have  nausea or vomiting.  You feel sweaty or light-headed.  You have a cough with phlegm (sputum) or you cough up blood.  You develop shortness of breath.   This information is not intended to replace advice given to you by your health care provider. Make sure you discuss any questions you have with your health care provider.   Document Released: 08/22/2005 Document Revised: 05/13/2015 Document Reviewed: 11/17/2014 Elsevier Interactive Patient Education [employer].

## 2016-01-04 NOTE — ED Notes (Signed)
Patient states she began having intermittent chest pain that started yesterday around 0800. Patient states she has had mid chest and right lower lower chest pain that radiates into her back. Patient also reports that she had not had a BM in 5 days and took a stool softener and then had a BM. Patient also c/o right shoulder//arm pain that began yesterday as well.

## 2016-01-04 NOTE — ED Notes (Signed)
EDP notified of continuing pain.

## 2016-01-04 NOTE — ED Provider Notes (Signed)
CSN: [id number]     Arrival date & time 01/04/16  0913 History   First MD Initiated Contact with Patient 01/04/16 [phone]     Chief Complaint  Patient presents with  . Chest Pain  . Arm Pain     (Consider location/radiation/quality/duration/timing/severity/associated sxs/prior Treatment) Patient is a 55 y.o. female presenting with chest pain and arm pain. The history is provided by the patient.  Chest Pain Associated symptoms: no abdominal pain, no back pain, no cough, no fever, no headache, no shortness of breath and not vomiting   Arm Pain Associated symptoms include chest pain. Pertinent negatives include no abdominal pain, no headaches and no shortness of breath.  Patient c/o mid and right sided chest pain onset at rest, at approx 8 PM last night. Pain constant. Dull. Moderate. Radiates to right shoulder and arm.  Worse w palpation and movement right arm. Occurs at rest. No relation to activity or exertion. No pleuritic pain. Denies associated nv, diaphoresis or sob. No recent exertional cp or discomfort. No unusual doe or fatigue. No fam hx premature cad.  Non smoker. No leg pain or swelling. Denies cough or uri c/o. No fever or chills.      Past Medical History  Diagnosis Date  . Hypertension   . Hypothyroidism   . Asthma    Past Surgical History  Procedure Laterality Date  . Thyroidectomy  2012  . Cesarean section  1990  . Vaginal hysterectomy  2012  . Abdominal hysterectomy    . Cholecystectomy N/A 12/30/2014    Procedure: LAPAROSCOPIC CHOLECYSTECTOMY WITH INTRAOPERATIVE CHOLANGIOGRAM;  Surgeon: Jackolyn Confer, MD;  Location: [facility];  Service: General;  Laterality: N/A;   Family History  Problem Relation Age of Onset  . Colon cancer Maternal Grandfather   . Hypertension Father   . COPD Mother    Social History  Substance Use Topics  . Smoking status: Never Smoker   . Smokeless tobacco: Never Used  . Alcohol Use: Yes     Comment: occasional   OB History    No data  available     Review of Systems  Constitutional: Negative for fever and chills.  HENT: Negative for sore throat.   Eyes: Negative for redness.  Respiratory: Negative for cough and shortness of breath.   Cardiovascular: Positive for chest pain. Negative for leg swelling.  Gastrointestinal: Negative for vomiting, abdominal pain and diarrhea.  Genitourinary: Negative for flank pain.  Musculoskeletal: Negative for back pain and neck pain.  Skin: Negative for rash.  Neurological: Negative for headaches.  Hematological: Does not bruise/bleed easily.  Psychiatric/Behavioral: Negative for confusion.      Allergies  Penicillins; Hydrocodone; Oxycodone; and Latex  Home Medications   Prior to Admission medications   Medication Sig Start Date End Date Taking? Authorizing Provider  albuterol (PROVENTIL HFA;VENTOLIN HFA) 108 (90 BASE) MCG/ACT inhaler Inhale 1-2 puffs into the lungs every 6 (six) hours as needed for wheezing or shortness of breath.  01/08/15 01/08/16 Yes Historical Provider, MD  Cholecalciferol (VITAMIN D) 2000 units CAPS Take 2,000 Units by mouth daily.   Yes Historical Provider, MD  docusate sodium (COLACE) 100 MG capsule Take 300 mg by mouth at bedtime as needed for mild constipation.   Yes Historical Provider, MD  levothyroxine (SYNTHROID, LEVOTHROID) 137 MCG tablet Take 137 mcg by mouth daily before breakfast. 01/08/15 01/08/16 Yes Historical Provider, MD  loratadine-pseudoephedrine (CLARITIN-D 24-HOUR) 10-240 MG 24 hr tablet Take 1 tablet by mouth daily as needed for allergies.  Yes Historical Provider, MD  losartan-hydrochlorothiazide (HYZAAR) 100-12.5 MG per tablet Take 1 tablet by mouth daily. 01/08/15  Yes Historical Provider, MD  Nebivolol HCl 20 MG TABS Take 20 mg by mouth daily. 01/08/15  Yes Historical Provider, MD  scopolamine (TRANSDERM-SCOP) 1 MG/3DAYS Place 1.5 mg onto the skin every 3 (three) days. 01/07/15 01/07/16 Yes Historical Provider, MD  fluconazole (DIFLUCAN) 150 MG  tablet Take 1 tablet (150 mg total) by mouth once. Patient not taking: Reported on 01/04/2016 10/07/15   Luvenia Redden, PA-C   BP 176/100 mmHg  Pulse 75  Temp(Src) 98.1 F (36.7 C) (Oral)  Resp 16  Ht 5\' 6"  (1.676 m)  Wt 83.008 kg  BMI 29.55 kg/m2  SpO2 100% Physical Exam  Constitutional: She appears well-developed and well-nourished. No distress.  HENT:  Mouth/Throat: Oropharynx is clear and moist.  Eyes: Conjunctivae are normal. No scleral icterus.  Neck: Neck supple. No tracheal deviation present.  Cardiovascular: Normal rate, regular rhythm, normal heart sounds and intact distal pulses.  Exam reveals no gallop and no friction rub.   No murmur heard. Pulmonary/Chest: Effort normal and breath sounds normal. No respiratory distress. She exhibits tenderness.  Symptoms reproduced with moving right shoulder/arm and palpation chest.   Abdominal: Soft. Normal appearance and bowel sounds are normal. She exhibits no distension. There is no tenderness.  Musculoskeletal: She exhibits no edema or tenderness.  CT spine non tender. Mild tenderness right trapezius area.   Neurological: She is alert.  Skin: Skin is warm and dry. No rash noted. She is not diaphoretic.  Psychiatric: She has a normal mood and affect.  Nursing note and vitals reviewed.   ED Course  Procedures (including critical care time) Labs Review   Results for orders placed or performed during the hospital encounter of [id number]  Basic metabolic panel  Result Value Ref Range   Sodium 143 135 - 145 mmol/L   Potassium 3.4 (L) 3.5 - 5.1 mmol/L   Chloride 106 101 - 111 mmol/L   CO2 28 22 - 32 mmol/L   Glucose, Bld 111 (H) 65 - 99 mg/dL   BUN 11 6 - 20 mg/dL   Creatinine, Ser 0.98 0.44 - 1.00 mg/dL   Calcium 9.3 8.9 - 10.3 mg/dL   GFR calc non Af Amer >60 >60 mL/min   GFR calc Af Amer >60 >60 mL/min   Anion gap 9 5 - 15  CBC  Result Value Ref Range   WBC 3.5 (L) 4.0 - 10.5 K/uL   RBC 4.63 3.87 - 5.11 MIL/uL    Hemoglobin 13.3 12.0 - 15.0 g/dL   HCT 38.9 36.0 - 46.0 %   MCV 84.0 78.0 - 100.0 fL   MCH 28.7 26.0 - 34.0 pg   MCHC 34.2 30.0 - 36.0 g/dL   RDW 13.6 11.5 - 15.5 %   Platelets 226 150 - 400 K/uL  I-stat troponin, ED  Result Value Ref Range   Troponin i, poc 0.00 0.00 - 0.08 ng/mL   Comment 3          I-stat troponin, ED  Result Value Ref Range   Troponin i, poc 0.00 0.00 - 0.08 ng/mL   Comment 3           Dg Chest 2 View  01/04/2016  CLINICAL DATA:  Chest pain and shortness of breath beginning yesterday. No known injury. Initial encounter. EXAM: CHEST  2 VIEW COMPARISON:  PA and lateral chest 09/16/2013 and 06/15/2012. FINDINGS: The lungs are  clear. Heart size is normal. No pneumothorax or pleural effusion. No focal bony abnormality. IMPRESSION: Negative chest. Electronically Signed   By: Inge Rise M.D.   On: 01/04/2016 10:06   ]  I have personally reviewed and evaluated these images and lab results as part of my medical decision-making.   EKG Interpretation   Date/Time:  Monday Jan 04 2016 09:20:23 EDT Ventricular Rate:  71 PR Interval:  157 QRS Duration: 93 QT Interval:  394 QTC Calculation: 428 R Axis:   21 Text Interpretation:  Sinus rhythm Probable left atrial enlargement Low  voltage, precordial leads RSR' in V1 or V2, right VCD or RVH Borderline T  abnormalities, anterior leads Baseline wander in lead(s) II aVF V5 V6  Confirmed by [facility] MD, JULIE ([id number]) on 01/04/2016 9:36:20 AM Also  confirmed by [facility] MD, JULIE ([id number]), editor [place], Leda Gauze [phone])   on 01/04/2016 9:39:44 AM      MDM   Iv ns. Monitor. Labs. Cxr.  Reviewed nursing notes and prior charts for additional history.   Motrin po.  Initial and repeat troponin 4 hrs after initial both negative/0, not increasing.   Pain appears musculoskeletal and not c/w acs.  Hr 62, rr 14, pulse ox 100%, no increased wob.  Pt appears stable for d/c.   rec close pcp f/u.      Lajean Saver,  MD 01/04/16 1501

## 2016-01-05 ENCOUNTER — Telehealth (HOSPITAL_BASED_OUTPATIENT_CLINIC_OR_DEPARTMENT_OTHER): Payer: Self-pay | Admitting: Emergency Medicine

## 2016-09-30 ENCOUNTER — Other Ambulatory Visit: Payer: Self-pay | Admitting: Family Medicine

## 2016-09-30 DIAGNOSIS — Z1231 Encounter for screening mammogram for malignant neoplasm of breast: Secondary | ICD-10-CM

## 2016-10-26 ENCOUNTER — Ambulatory Visit
Admission: RE | Admit: 2016-10-26 | Discharge: 2016-10-26 | Disposition: A | Discharge status: Home / Self Care | Payer: Managed Care, Other (non HMO) | Source: Ambulatory Visit | Attending: Family Medicine | Admitting: Family Medicine

## 2016-10-26 DIAGNOSIS — Z1231 Encounter for screening mammogram for malignant neoplasm of breast: Secondary | ICD-10-CM

## 2016-11-01 ENCOUNTER — Encounter (HOSPITAL_COMMUNITY): Payer: Self-pay | Admitting: Emergency Medicine

## 2016-11-01 ENCOUNTER — Emergency Department (HOSPITAL_COMMUNITY)
Admission: EM | Admit: 2016-11-01 | Discharge: 2016-11-01 | Disposition: A | Discharge status: Home / Self Care | Payer: Managed Care, Other (non HMO) | Attending: Emergency Medicine | Admitting: Emergency Medicine

## 2016-11-01 DIAGNOSIS — J45909 Unspecified asthma, uncomplicated: Secondary | ICD-10-CM | POA: Diagnosis not present

## 2016-11-01 DIAGNOSIS — G8929 Other chronic pain: Secondary | ICD-10-CM | POA: Insufficient documentation

## 2016-11-01 DIAGNOSIS — M62830 Muscle spasm of back: Secondary | ICD-10-CM | POA: Diagnosis present

## 2016-11-01 DIAGNOSIS — M5441 Lumbago with sciatica, right side: Secondary | ICD-10-CM | POA: Diagnosis not present

## 2016-11-01 DIAGNOSIS — Z91040 Latex allergy status: Secondary | ICD-10-CM | POA: Diagnosis not present

## 2016-11-01 DIAGNOSIS — E039 Hypothyroidism, unspecified: Secondary | ICD-10-CM | POA: Diagnosis not present

## 2016-11-01 DIAGNOSIS — I10 Essential (primary) hypertension: Secondary | ICD-10-CM | POA: Insufficient documentation

## 2016-11-01 MED ORDER — LIDOCAINE 5 % EX PTCH
1.0000 | MEDICATED_PATCH | Freq: Once | CUTANEOUS | Status: DC
  Administered 2016-11-01: 1 via TRANSDERMAL
  Filled 2016-11-01: qty 1

## 2016-11-01 MED ORDER — METHOCARBAMOL 500 MG PO TABS
1000.0000 mg | ORAL_TABLET | Freq: Once | ORAL | Status: AC
Start: 2016-11-01 — End: 2016-11-01
  Administered 2016-11-01: 1000 mg via ORAL
  Filled 2016-11-01: qty 2

## 2016-11-01 MED ORDER — LIDOCAINE 5 % EX PTCH: 1.0000 | MEDICATED_PATCH | CUTANEOUS | 0 refills | Status: DC

## 2016-11-01 MED ORDER — PREDNISONE 10 MG PO TABS: 20.0000 mg | ORAL_TABLET | Freq: Two times a day (BID) | ORAL | 0 refills | Status: AC

## 2016-11-01 MED ORDER — METHOCARBAMOL 500 MG PO TABS: 500.0000 mg | ORAL_TABLET | Freq: Two times a day (BID) | ORAL | 0 refills | Status: DC

## 2016-11-01 MED ORDER — IBUPROFEN 800 MG PO TABS: 800.0000 mg | ORAL_TABLET | Freq: Three times a day (TID) | ORAL | 0 refills | Status: DC

## 2016-11-01 MED ORDER — KETOROLAC TROMETHAMINE 60 MG/2ML IM SOLN
60.0000 mg | Freq: Once | INTRAMUSCULAR | Status: AC
  Administered 2016-11-01: 60 mg via INTRAMUSCULAR
  Filled 2016-11-01: qty 2

## 2016-11-01 NOTE — ED Triage Notes (Signed)
Patient states that starting yesterday started having back spasms on right side. Patient reports she set down then couldn't get back up due to pain.

## 2016-11-01 NOTE — ED Provider Notes (Signed)
[facility] DEPT Provider Note   CSN: [id number] Arrival date & time: 11/01/16  1246   By signing my name below, I, Neta Mends, attest that this documentation has been prepared under the direction and in the presence of Shawn Joy, PA-C. Electronically Signed: Neta Mends, ED Scribe. 11/01/2016. 2:16 PM.   History   Chief Complaint Chief Complaint  Patient presents with  . back spasm    The history is provided by the patient. No language interpreter was used.   HPI Comments:  Kimberly Ferguson is a 56 y.o. female who presents to the Emergency Department complaining of constant acute on chronic back pain x 2 days. Pt describes the pain as right-sided spasms, and states that the pain radiates from her right mid back down into the right posterior leg. Patient states that she has had problems with back pain like this "for years." Pt has not spoken with her PCP about her current episode of pain. Pt has taken ibuprofen with no relief. Patient denies falls/recent trauma, neuro deficits, urinary complaints, changes in bowel or bladder function, or any other complaints.  PCP: Phineas Inches, MD   Past Medical History:  Diagnosis Date  . Asthma   . Hypertension   . Hypothyroidism     Patient Active Problem List   Diagnosis Date Noted  . Acute cholecystitis 12/30/2014  . Essential hypertension 12/30/2014  . Hypothyroidism 12/30/2014  . Papillary thyroid carcinoma, follicular variant [id number]    Past Surgical History:  Procedure Laterality Date  . ABDOMINAL HYSTERECTOMY    . CESAREAN SECTION  1990  . CHOLECYSTECTOMY N/A 12/30/2014   Procedure: LAPAROSCOPIC CHOLECYSTECTOMY WITH INTRAOPERATIVE CHOLANGIOGRAM;  Surgeon: Jackolyn Confer, MD;  Location: [facility];  Service: General;  Laterality: N/A;  . THYROIDECTOMY  2012  . VAGINAL HYSTERECTOMY  2012    OB History    No data available       Home Medications    Prior to Admission medications   Medication Sig  Start Date End Date Taking? Authorizing Provider  albuterol (PROVENTIL HFA;VENTOLIN HFA) 108 (90 BASE) MCG/ACT inhaler Inhale 1-2 puffs into the lungs every 6 (six) hours as needed for wheezing or shortness of breath.  01/08/15 01/08/16  Historical Provider, MD  Cholecalciferol (VITAMIN D) 2000 units CAPS Take 2,000 Units by mouth daily.    Historical Provider, MD  docusate sodium (COLACE) 100 MG capsule Take 300 mg by mouth at bedtime as needed for mild constipation.    Historical Provider, MD  fluconazole (DIFLUCAN) 150 MG tablet Take 1 tablet (150 mg total) by mouth once. Patient not taking: Reported on 01/04/2016 10/07/15   Luvenia Redden, PA-C  ibuprofen (ADVIL,MOTRIN) 800 MG tablet Take 1 tablet (800 mg total) by mouth 3 (three) times daily. 11/01/16   Lorayne Bender, PA-C  levothyroxine (SYNTHROID, LEVOTHROID) 137 MCG tablet Take 137 mcg by mouth daily before breakfast. 01/08/15 01/08/16  Historical Provider, MD  lidocaine (LIDODERM) 5 % Place 1 patch onto the skin daily. Remove & Discard patch within 12 hours or as directed by MD 11/01/16   Lorayne Bender, PA-C  loratadine-pseudoephedrine (CLARITIN-D 24-HOUR) 10-240 MG 24 hr tablet Take 1 tablet by mouth daily as needed for allergies.    Historical Provider, MD  losartan-hydrochlorothiazide (HYZAAR) 100-12.5 MG per tablet Take 1 tablet by mouth daily. 01/08/15   Historical Provider, MD  methocarbamol (ROBAXIN) 500 MG tablet Take 1 tablet (500 mg total) by mouth 2 (two) times daily. 11/01/16   Shawn  C Joy, PA-C  Nebivolol HCl 20 MG TABS Take 20 mg by mouth daily. 01/08/15   Historical Provider, MD  predniSONE (DELTASONE) 10 MG tablet Take 2 tablets (20 mg total) by mouth 2 (two) times daily with a meal. 11/01/16 11/05/16  Lorayne Bender, PA-C    Family History Family History  Problem Relation Age of Onset  . Hypertension Father   . Colon cancer Maternal Grandfather   . COPD Mother     Social History Social History  Substance Use Topics  . Smoking status: Never  Smoker  . Smokeless tobacco: Never Used  . Alcohol use Yes     Comment: occasional     Allergies   Penicillins; Hydrocodone; Oxycodone; and Latex   Review of Systems Review of Systems  Constitutional: Negative for fever.  Genitourinary: Negative for difficulty urinating, flank pain and frequency.  Musculoskeletal: Positive for back pain and myalgias.  Neurological: Negative for weakness and numbness.     Physical Exam Updated Vital Signs BP 140/86 (BP Location: Left Arm)   Pulse 73   Temp 98.5 F (36.9 C) (Oral)   Resp 18   Ht 5\' 5"  (1.651 m)   Wt 190 lb (86.2 kg)   SpO2 100%   BMI 31.62 kg/m   Physical Exam  Constitutional: She appears well-developed and well-nourished. No distress.  HENT:  Head: Normocephalic and atraumatic.  Eyes: Conjunctivae are normal.  Neck: Normal range of motion. Neck supple.  Cardiovascular: Normal rate, regular rhythm and intact distal pulses.   Pulmonary/Chest: Effort normal.  Abdominal: She exhibits no distension.  Musculoskeletal: She exhibits tenderness.  Tenderness to thoracic and lumbar musculature on right extending into right buttocks. Normal motor function intact in both lower extremities and spine. No midline spinal tenderness.   Neurological: She is alert.  No sensory deficits. Strength 5/5 in both lower extremities. No gait disturbance. Coordination intact including heel to shin.   Skin: Skin is warm and dry. She is not diaphoretic.  Psychiatric: She has a normal mood and affect. Her behavior is normal.  Nursing note and vitals reviewed.    ED Treatments / Results  DIAGNOSTIC STUDIES:  Oxygen Saturation is 100% on RA, normal by my interpretation.    COORDINATION OF CARE:  2:16 PM Discussed treatment plan with pt at bedside and pt agreed to plan.   Labs (all labs ordered are listed, but only abnormal results are displayed) Labs Reviewed - No data to display  EKG  EKG Interpretation None       Radiology No  results found.  Procedures Procedures (including critical care time)  Medications Ordered in ED Medications  lidocaine (LIDODERM) 5 % 1 patch (1 patch Transdermal Patch Applied 11/01/16 1446)  ketorolac (TORADOL) injection 60 mg (60 mg Intramuscular Given 11/01/16 1443)  methocarbamol (ROBAXIN) tablet 1,000 mg (1,000 mg Oral Given 11/01/16 1442)     Initial Impression / Assessment and Plan / ED Course  I have reviewed the triage vital signs and the nursing notes.  Pertinent labs & imaging results that were available during my care of the patient were reviewed by me and considered in my medical decision making (see chart for details).     Patient presents with acute on chronic back pain with sciatica distribution. She has no neuro deficits on exam. No red flag symptoms. Patient voices significant improvement with conservative measures here in the ED. Orthopedic follow-up. Further home care and return precautions discussed. Patient voices understanding of all instructions and is  comfortable with discharge.     Final Clinical Impressions(s) / ED Diagnoses   Final diagnoses:  Chronic right-sided low back pain with right-sided sciatica    New Prescriptions New Prescriptions   IBUPROFEN (ADVIL,MOTRIN) 800 MG TABLET    Take 1 tablet (800 mg total) by mouth 3 (three) times daily.   LIDOCAINE (LIDODERM) 5 %    Place 1 patch onto the skin daily. Remove & Discard patch within 12 hours or as directed by MD   METHOCARBAMOL (ROBAXIN) 500 MG TABLET    Take 1 tablet (500 mg total) by mouth 2 (two) times daily.   PREDNISONE (DELTASONE) 10 MG TABLET    Take 2 tablets (20 mg total) by mouth 2 (two) times daily with a meal.  I personally performed the services described in this documentation, which was scribed in my presence. The recorded information has been reviewed and is accurate.    Lorayne Bender, PA-C 11/01/16 [place], MD 11/02/16 [phone]

## 2016-11-01 NOTE — Discharge Instructions (Signed)
Expect your soreness to increase over the next 2-3 days. Take it easy, but do not lay around too much as this may make any stiffness worse.  °Antiinflammatory medications: Take 500 mg of naproxen every 12 hours or 800 mg of ibuprofen every 8 hours for the next 3 days. Take these medications with food to avoid upset stomach. Choose only one of these medications, do not take them together. ° °Muscle relaxer: Robaxin is a muscle relaxer and may help loosen stiff muscles. Do not take the Robaxin while driving or performing other dangerous activities.  ° °Lidocaine patches: These are available via either prescription or over-the-counter. The over-the-counter option may be more economical one and are likely just as effective. There are multiple over-the-counter brands, such as Salonpas. ° °Exercises: Be sure to perform the attached exercises starting with three times a week and working up to performing them daily. This is an essential part of preventing long term problems.  ° °Follow up with a primary care provider for any future management of these complaints. °

## 2016-11-01 NOTE — ED Notes (Signed)
Patient offered hallway bed to be seen faster.  Patient wanting to wait in lobby for a private room to come open.

## 2016-11-01 NOTE — ED Notes (Signed)
Patient ambulatory without difficulty.

## 2016-11-23 ENCOUNTER — Ambulatory Visit: Payer: Managed Care, Other (non HMO) | Admitting: Rehabilitation

## 2016-11-30 ENCOUNTER — Ambulatory Visit: Payer: Managed Care, Other (non HMO) | Attending: Family Medicine | Admitting: Physical Therapy

## 2016-12-06 ENCOUNTER — Telehealth: Payer: Self-pay | Admitting: Physical Therapy

## 2016-12-06 NOTE — Telephone Encounter (Signed)
Patient cxl PT eval on 11/23/16, rescheduled for 11/20/16 and no showed

## 2016-12-28 ENCOUNTER — Ambulatory Visit: Payer: Managed Care, Other (non HMO) | Admitting: Physician Assistant

## 2016-12-29 ENCOUNTER — Ambulatory Visit: Payer: Managed Care, Other (non HMO) | Admitting: Family Medicine

## 2017-02-01 ENCOUNTER — Ambulatory Visit: Payer: Self-pay | Admitting: Podiatry

## 2017-02-08 ENCOUNTER — Ambulatory Visit: Payer: Managed Care, Other (non HMO) | Admitting: Physician Assistant

## 2017-02-15 ENCOUNTER — Encounter: Payer: Self-pay | Admitting: Podiatry

## 2017-02-15 ENCOUNTER — Ambulatory Visit (INDEPENDENT_AMBULATORY_CARE_PROVIDER_SITE_OTHER): Payer: Managed Care, Other (non HMO)

## 2017-02-15 ENCOUNTER — Ambulatory Visit (INDEPENDENT_AMBULATORY_CARE_PROVIDER_SITE_OTHER): Payer: Managed Care, Other (non HMO) | Admitting: Podiatry

## 2017-02-15 VITALS — BP 150/84 | HR 60 | Resp 16 | Ht 65.0 in | Wt 197.0 lb

## 2017-02-15 DIAGNOSIS — M779 Enthesopathy, unspecified: Secondary | ICD-10-CM

## 2017-02-15 DIAGNOSIS — M7662 Achilles tendinitis, left leg: Secondary | ICD-10-CM

## 2017-02-15 DIAGNOSIS — M79672 Pain in left foot: Secondary | ICD-10-CM

## 2017-02-15 MED ORDER — DICLOFENAC SODIUM 75 MG PO TBEC: 75.0000 mg | DELAYED_RELEASE_TABLET | Freq: Two times a day (BID) | ORAL | 2 refills | Status: DC

## 2017-02-15 MED ORDER — TRIAMCINOLONE ACETONIDE 10 MG/ML IJ SUSP
10.0000 mg | Freq: Once | INTRAMUSCULAR | Status: AC
  Administered 2017-02-15: 10 mg

## 2017-02-15 NOTE — Progress Notes (Signed)
   Subjective:    Patient ID: Kimberly Ferguson, female    DOB: June 16, 1961, 56 y.o.   MRN: [id number]  HPI Chief Complaint  Patient presents with  . Foot Pain    Left foot; back of heel; pt stated, "Foot feel stiff; makes me limp"; x5 months      Review of Systems  Constitutional: Positive for unexpected weight change.  HENT: Positive for sinus pain.   Eyes: Positive for itching and visual disturbance.  Respiratory: Positive for cough and shortness of breath.   Gastrointestinal: Positive for constipation.  Endocrine: Positive for polyuria.  Genitourinary: Positive for urgency.  Musculoskeletal: Positive for arthralgias, back pain and myalgias.  All other systems reviewed and are negative.      Objective:   Physical Exam        Assessment & Plan:

## 2017-02-15 NOTE — Patient Instructions (Signed)

## 2017-02-16 NOTE — Progress Notes (Signed)
Subjective:    Patient ID: Kimberly Ferguson, female   DOB: 56 y.o.   MRN: [id number]   HPI patient presents stating for 5 months she's had severe pain in the back of the left heel that's gradually worsened over that time and makes walking difficult and states that it also makes shoe gear difficult    Review of Systems  All other systems reviewed and are negative.       Objective:  Physical Exam  Cardiovascular: Intact distal pulses.   Musculoskeletal: Normal range of motion.  Neurological: She is alert. Cranial nerve deficit: neurovascular status found to be intact muscle strength was adequate range of motion within normal limits with patient noted to have exquisite discomfort in the posterior lateral aspect left heel at the insertional point into the Achilles tendon calcaneus   Skin: Skin is warm.  Nursing note and vitals reviewed.  neurovascular status was intact muscle strength was adequate and digital perfusion within normal limits     Assessment:     Acute Achilles tendinitis left lateral side at the insertion calcaneus    Plan:    H&P condition reviewed and at this point I did a careful sheath injection left 3 mg Dexon some Kenalog 5 mill grams Xylocaine after first discussing the possibility of rupture with patient. I then went ahead and applied air fracture walker with instructions on usage for the next 4 weeks and patient be seen back and went ahead and placed on diclofenac 75 mg twice a day  X-rays indicate spur the posterior aspect left heel

## 2017-02-28 ENCOUNTER — Other Ambulatory Visit: Payer: Self-pay

## 2017-02-28 ENCOUNTER — Ambulatory Visit: Payer: Managed Care, Other (non HMO) | Admitting: Gastroenterology

## 2017-03-01 ENCOUNTER — Ambulatory Visit: Payer: Managed Care, Other (non HMO) | Admitting: Physician Assistant

## 2017-03-15 ENCOUNTER — Ambulatory Visit (INDEPENDENT_AMBULATORY_CARE_PROVIDER_SITE_OTHER): Payer: Managed Care, Other (non HMO) | Admitting: Podiatry

## 2017-03-15 DIAGNOSIS — M7662 Achilles tendinitis, left leg: Secondary | ICD-10-CM

## 2017-03-15 NOTE — Progress Notes (Signed)
Subjective:    Patient ID: Kimberly Ferguson, female   DOB: 56 y.o.   MRN: [id number]   HPI patient states she's improving with her left tendon but it still sore when palpated    ROS      Objective:  Physical Exam neurovascular status intact negative Homan sign was noted with patient's posterior tendon group sore but much improved from previous visit     Assessment:    Achilles tendinitis left still present but improved with discomfort upon deep palpation     Plan:    H&P condition reviewed at great length. I discussed different treatment modalities available at this time Lilia Pro to try Achilles tendon sleeve was silicone heel left physical therapy with stretching that was explained ice therapy and will be seen back as symptoms indicate and will gradually reduce boot over the next 2-3 weeks

## 2017-03-24 ENCOUNTER — Encounter: Payer: Self-pay | Admitting: Gastroenterology

## 2017-04-20 ENCOUNTER — Encounter: Payer: Self-pay | Admitting: Gastroenterology

## 2017-04-20 ENCOUNTER — Ambulatory Visit (INDEPENDENT_AMBULATORY_CARE_PROVIDER_SITE_OTHER): Payer: Managed Care, Other (non HMO) | Admitting: Gastroenterology

## 2017-04-20 ENCOUNTER — Encounter (INDEPENDENT_AMBULATORY_CARE_PROVIDER_SITE_OTHER): Payer: Self-pay

## 2017-04-20 VITALS — BP 124/86 | HR 72 | Ht 65.0 in | Wt 192.5 lb

## 2017-04-20 DIAGNOSIS — R1319 Other dysphagia: Secondary | ICD-10-CM

## 2017-04-20 DIAGNOSIS — R1310 Dysphagia, unspecified: Secondary | ICD-10-CM

## 2017-04-20 DIAGNOSIS — K219 Gastro-esophageal reflux disease without esophagitis: Secondary | ICD-10-CM

## 2017-04-20 NOTE — Progress Notes (Signed)
[facility] Consult Note:  History: PETRONELLA SHUFORD 04/20/2017  Referring physician: Bernerd Limbo, MD  Reason for consult/chief complaint: Dysphagia (pt states sometimes she gets choked x 1 year.  Also c/o bad taste in mouth  since 2012.) and Abdominal Pain (x 1 year.)   Subjective  HPI:  This is a 56 year old woman referred by primary care noted above for years of chronic reflux and dysphagia. She describes intermittent pyrosis and regurgitation for which she has not taken any medicines regularly. She says she is a person who does not like to take medicine. She sometimes has a feeling of food "rough" or stuck in the neck or upper chest, and it does not seem to happen with liquids. She denies nausea, vomiting, early satiety, altered bowel habits, weight loss or rectal bleeding. Keysi was concerned because her brother was diagnosed with gastric cancer last year. She does not know many details because he is apparently somewhat secretive about the diagnosis. Patsey last had a colonoscopy for screening in June 2013.   ROS:  Review of Systems  Constitutional: Negative for appetite change and unexpected weight change.  HENT: Negative for mouth sores and voice change.   Eyes: Negative for pain and redness.  Respiratory: Negative for cough and shortness of breath.   Cardiovascular: Negative for chest pain and palpitations.  Genitourinary: Negative for dysuria and hematuria.  Musculoskeletal: Negative for arthralgias and myalgias.  Skin: Negative for pallor and rash.  Neurological: Negative for weakness and headaches.  Hematological: Negative for adenopathy.     Past Medical History: Past Medical History:  Diagnosis Date  . Asthma   . Hypertension   . Hypothyroidism   . Insomnia   . Patellofemoral syndrome, right    right knee   . Thyroid cancer ([place])   . Vitamin D deficiency      Past Surgical History: Past Surgical History:  Procedure Laterality Date    . ABDOMINAL HYSTERECTOMY    . CESAREAN SECTION  1990  . CHOLECYSTECTOMY N/A 12/30/2014   Procedure: LAPAROSCOPIC CHOLECYSTECTOMY WITH INTRAOPERATIVE CHOLANGIOGRAM;  Surgeon: Jackolyn Confer, MD;  Location: [facility];  Service: General;  Laterality: N/A;  . THYROIDECTOMY  2012     Family History: Family History  Problem Relation Age of Onset  . COPD Father   . Stomach cancer Brother   . Colon cancer Maternal Grandfather   . Prostate cancer Maternal Grandfather   . Heart disease Mother   . Hypertension Mother   . Arthritis Mother   . Cervical cancer Maternal Aunt   . Rectal cancer Neg Hx   . Liver cancer Neg Hx   . Esophageal cancer Neg Hx     Social History: Social History   Social History  . Marital status: Married    Spouse name: N/A  . Number of children: 1  . Years of education: N/A   Occupational History  . Med Asst Student/ [employer]    Social History Main Topics  . Smoking status: Light Tobacco Smoker  . Smokeless tobacco: Never Used     Comment: back in high school  . Alcohol use Yes     Comment: occasional  . Drug use: No  . Sexual activity: Not Currently   Other Topics Concern  . None   Social History Narrative  . None    Allergies: Allergies  Allergen Reactions  . Penicillins Anaphylaxis    Has patient had a PCN reaction causing immediate rash, facial/tongue/throat swelling, SOB or lightheadedness  with hypotension: Yes Has patient had a PCN reaction causing severe rash involving mucus membranes or skin necrosis: No Has patient had a PCN reaction that required hospitalization Yes Has patient had a PCN reaction occurring within the last 10 years: No If all of the above answers are "NO", then may proceed with Cephalosporin use.   Marland Kitchen Hydrocodone Nausea And Vomiting  . Oxycodone Nausea And Vomiting  . Latex Swelling and Rash    Outpatient Meds: Current Outpatient Prescriptions  Medication Sig Dispense Refill  . Cholecalciferol (VITAMIN D)  2000 units CAPS Take 2,000 Units by mouth daily.    Marland Kitchen lidocaine (LIDODERM) 5 % Place 1 patch onto the skin daily. Remove & Discard patch within 12 hours or as directed by MD 30 patch 0  . loratadine-pseudoephedrine (CLARITIN-D 24-HOUR) 10-240 MG 24 hr tablet Take 1 tablet by mouth daily as needed for allergies.    Marland Kitchen losartan-hydrochlorothiazide (HYZAAR) 100-12.5 MG per tablet Take 1 tablet by mouth daily.    . Nebivolol HCl 20 MG TABS Take 20 mg by mouth daily.    Marland Kitchen albuterol (PROVENTIL HFA;VENTOLIN HFA) 108 (90 BASE) MCG/ACT inhaler Inhale 1-2 puffs into the lungs every 6 (six) hours as needed for wheezing or shortness of breath.     . levothyroxine (SYNTHROID, LEVOTHROID) 137 MCG tablet Take 137 mcg by mouth daily before breakfast.     No current facility-administered medications for this visit.       ___________________________________________________________________ Objective   Exam:  BP 124/86   Pulse 72   Ht 5\' 5"  (1.651 m)   Wt 192 lb 8 oz (87.3 kg)   BMI 32.03 kg/m    General: this is a(n) Well-appearing woman   Eyes: sclera anicteric, no redness  ENT: oral mucosa moist without lesions, no cervical or supraclavicular lymphadenopathy, good dentition, thyroidectomy scar  CV: RRR without murmur, S1/S2, no JVD, no peripheral edema  Resp: clear to auscultation bilaterally, normal RR and effort noted  GI: soft, no tenderness, with active bowel sounds. No guarding or palpable organomegaly noted.  Skin; warm and dry, no rash or jaundice noted  Neuro: awake, alert and oriented x 3. Normal gross motor function and fluent speech  No recent data  Assessment: Encounter Diagnoses  Name Primary?  . Gastroesophageal reflux disease, esophagitis presence not specified Yes  . Esophageal dysphagia     It sounds as if she has had long-standing intermittent GERD symptoms. He is reluctant to take medicine for this. It is also difficult to tell if the dysphagia is mechanical or  GERD related dysmotility.  Plan:  EGD with possible dilation, she is agreeable after a thorough discussion of procedure and risks.  The benefits and risks of the planned procedure were described in detail with the patient or (when appropriate) their health care proxy.  Risks were outlined as including, but not limited to, bleeding, infection, perforation, adverse medication reaction leading to cardiac or pulmonary decompensation, or pancreatitis (if ERCP).  The limitation of incomplete mucosal visualization was also discussed.  No guarantees or warranties were given.  She would like to hold off on any antacid meds until after her procedure.  Thank you for the courtesy of this consult.  Please call me with any questions or concerns.  Nelida Meuse III  CC: Bernerd Limbo, MD

## 2017-04-20 NOTE — Patient Instructions (Signed)
If you are age 56 or older, your body mass index should be between 23-30. Your Body mass index is 32.03 kg/m. If this is out of the aforementioned range listed, please consider follow up with your Primary Care Provider.  If you are age 44 or younger, your body mass index should be between 19-25. Your Body mass index is 32.03 kg/m. If this is out of the aformentioned range listed, please consider follow up with your Primary Care Provider.   You have been scheduled for an endoscopy. Please follow written instructions given to you at your visit today. If you use inhalers (even only as needed), please bring them with you on the day of your procedure. Your physician has requested that you go to www.startemmi.com and enter the access code given to you at your visit today. This web site gives a general overview about your procedure. However, you should still follow specific instructions given to you by our office regarding your preparation for the procedure.  Thank you for choosing [facility]  Dr Wilfrid Lund III

## 2017-05-01 ENCOUNTER — Telehealth: Payer: Self-pay | Admitting: Gastroenterology

## 2017-05-01 NOTE — Telephone Encounter (Signed)
Patient canceled endo procedure for Wednesday 05/03/17 due to having training for work that she can not miss. Patient states she will check her schedule and call back.

## 2017-05-03 ENCOUNTER — Encounter: Payer: Managed Care, Other (non HMO) | Admitting: Gastroenterology

## 2017-05-25 ENCOUNTER — Encounter: Payer: Self-pay | Admitting: Gastroenterology

## 2017-05-25 ENCOUNTER — Ambulatory Visit (AMBULATORY_SURGERY_CENTER): Payer: Self-pay | Admitting: Gastroenterology

## 2017-05-25 VITALS — BP 143/93 | HR 72 | Temp 98.4°F | Resp 20 | Ht 65.0 in | Wt 192.0 lb

## 2017-05-25 DIAGNOSIS — K317 Polyp of stomach and duodenum: Secondary | ICD-10-CM

## 2017-05-25 DIAGNOSIS — R1310 Dysphagia, unspecified: Secondary | ICD-10-CM

## 2017-05-25 MED ORDER — SODIUM CHLORIDE 0.9 % IV SOLN: 500.0000 mL | INTRAVENOUS | Status: DC

## 2017-05-25 NOTE — Progress Notes (Signed)
To PACU, VSS. Report to [email or web address] 

## 2017-05-25 NOTE — Op Note (Signed)
[facility] Patient Name: Kimberly Ferguson Procedure Date: 05/25/2017 1:14 PM MRN: [id number] Endoscopist: Mallie Mussel L. Loletha Carrow , MD Age: 56 Referring MD:  Date of Birth: 10-21-1960 Gender: Female Account #: [id number] Procedure:                Upper GI endoscopy Indications:              Dysphagia, Heartburn Medicines:                Monitored Anesthesia Care Procedure:                Pre-Anesthesia Assessment:                           - Prior to the procedure, a History and Physical                            was performed, and patient medications and                            allergies were reviewed. The patient's tolerance of                            previous anesthesia was also reviewed. The risks                            and benefits of the procedure and the sedation                            options and risks were discussed with the patient.                            All questions were answered, and informed consent                            was obtained. Prior Anticoagulants: The patient has                            taken no previous anticoagulant or antiplatelet                            agents. ASA Grade Assessment: II - A patient with                            mild systemic disease. After reviewing the risks                            and benefits, the patient was deemed in                            satisfactory condition to undergo the procedure.                           After obtaining informed consent, the endoscope was  passed under direct vision. Throughout the                            procedure, the patient's blood pressure, pulse, and                            oxygen saturations were monitored continuously. The                            Endoscope was introduced through the mouth, and                            advanced to the second part of duodenum. The upper                            GI endoscopy was accomplished  without difficulty.                            The patient tolerated the procedure well. Scope In: Scope Out: Findings:                 The larynx was normal.                           The esophagus was normal.                           Multiple semi-sessile fundic gland polyps with no                            bleeding and no stigmata of recent bleeding were                            found in the gastric fundus and in the gastric                            body. Biopsies were taken with a cold forceps for                            histology.                           The cardia and gastric fundus were normal on                            retroflexion.                           The examined duodenum was normal. Complications:            No immediate complications. Estimated Blood Loss:     Estimated blood loss was minimal. Impression:               - Normal larynx.                           - Normal esophagus.                           -  Multiple fundic gland polyps. Biopsied.                           - Normal examined duodenum.                           - Globus-type sensation patient reports may be from                            GERD or allergies. Recommendation:           - Patient has a contact number available for                            emergencies. The signs and symptoms of potential                            delayed complications were discussed with the                            patient. Return to normal activities tomorrow.                            Written discharge instructions were provided to the                            patient.                           - Resume previous diet.                           - Continue present medications.                           - Await pathology results.                           - Patient will consider a trial of H2RA                            (ranitidine) or PPI (omeprazole). Henry L. Loletha Carrow, MD 05/25/2017 1:43:29 PM This  report has been signed electronically.

## 2017-05-25 NOTE — Patient Instructions (Signed)
YOU HAD AN ENDOSCOPIC PROCEDURE TODAY: Refer to the procedure report and other information in the discharge instructions given to you for any specific questions about what was found during the examination. If this information does not answer your questions, please call [facility] office at [phone] to clarify.   YOU SHOULD EXPECT: Some feelings of bloating in the abdomen. Passage of more gas than usual. Walking can help get rid of the air that was put into your GI tract during the procedure and reduce the bloating. If you had a lower endoscopy (such as a colonoscopy or flexible sigmoidoscopy) you may notice spotting of blood in your stool or on the toilet paper. Some abdominal soreness may be present for a day or two, also.  DIET: Your first meal following the procedure should be a light meal and then it is ok to progress to your normal diet. A half-sandwich or bowl of soup is an example of a good first meal. Heavy or fried foods are harder to digest and may make you feel nauseous or bloated. Drink plenty of fluids but you should avoid alcoholic beverages for 24 hours. If you had a esophageal dilation, please see attached instructions for diet.    ACTIVITY: Your care partner should take you home directly after the procedure. You should plan to take it easy, moving slowly for the rest of the day. You can resume normal activity the day after the procedure however YOU SHOULD NOT DRIVE, use power tools, machinery or perform tasks that involve climbing or major physical exertion for 24 hours (because of the sedation medicines used during the test).   SYMPTOMS TO REPORT IMMEDIATELY: A gastroenterologist can be reached at any hour. Please call [phone]  for any of the following symptoms:   Following upper endoscopy (EGD, EUS, ERCP, esophageal dilation) Vomiting of blood or coffee ground material  New, significant abdominal pain  New, significant chest pain or pain under the shoulder blades  Painful or  persistently difficult swallowing  New shortness of breath  Black, tarry-looking or red, bloody stools  FOLLOW UP:  If any biopsies were taken you will be contacted by phone or by letter within the next 1-3 weeks. Call [phone]  if you have not heard about the biopsies in 3 weeks.  Please also call with any specific questions about appointments or follow up tests.  

## 2017-05-25 NOTE — Progress Notes (Signed)
Called to room to assist during endoscopic procedure.  Patient ID and intended procedure confirmed with present staff. Received instructions for my participation in the procedure from the performing physician.  

## 2017-05-26 ENCOUNTER — Telehealth: Payer: Self-pay | Admitting: *Deleted

## 2017-05-26 NOTE — Telephone Encounter (Signed)
  Follow up Call-  Call back number 05/25/2017  Post procedure Call Back phone  # [phone]  Permission to leave phone message Yes  Some recent data might be hidden     Patient questions:  Do you have a fever, pain , or abdominal swelling? No. Pain Score  0 *  Have you tolerated food without any problems? Yes.    Have you been able to return to your normal activities? Yes.    Do you have any questions about your discharge instructions: Diet   no Medications  No. Follow up visit  No.  Do you have questions or concerns about your Care? Yes.    Actions: * If pain score is 4 or above: No action needed, pain <4.   Pt states that she is very upset because she "was supposed to have her stomach looked at because she is having pain there and the doctor told her that he didn't look into her stomach" after the procedure.  I reassured her that her stomach was examined and that biopsies were actually taken of the stomach.  She had her report at home and we looked at the different pictures together and I explained which one pertained to the stomach.  She states she feels so much better now and will await biopsy results.

## 2017-05-30 ENCOUNTER — Encounter: Payer: Managed Care, Other (non HMO) | Admitting: Gastroenterology

## 2017-06-06 ENCOUNTER — Other Ambulatory Visit: Payer: Self-pay

## 2017-06-06 DIAGNOSIS — R109 Unspecified abdominal pain: Secondary | ICD-10-CM

## 2017-06-22 ENCOUNTER — Telehealth: Payer: Self-pay

## 2017-06-22 ENCOUNTER — Other Ambulatory Visit: Payer: Self-pay

## 2017-06-22 NOTE — Telephone Encounter (Signed)
Called [facility], spoke to [facility]. They called patient on 06/13/17 and she states she is feeling better and declined to schedule CT scan. Patient is scheduled still for a follow up visit in office 07/13/17.

## 2017-06-22 NOTE — Telephone Encounter (Signed)
-----   Message from Darden Dates sent at 06/16/2017  7:19 AM EDT ----- Jena Gauss, I still have order for CT.  This pt is still not scheduled. Thanks, Amy

## 2017-06-23 NOTE — Telephone Encounter (Signed)
Understood, thanks

## 2017-07-13 ENCOUNTER — Ambulatory Visit: Payer: Self-pay | Admitting: Gastroenterology

## 2017-07-13 ENCOUNTER — Other Ambulatory Visit: Payer: Self-pay

## 2017-08-01 ENCOUNTER — Other Ambulatory Visit: Payer: Self-pay

## 2017-08-01 ENCOUNTER — Emergency Department (HOSPITAL_COMMUNITY)
Admission: EM | Admit: 2017-08-01 | Discharge: 2017-08-01 | Disposition: A | Discharge status: Home / Self Care | Payer: Self-pay | Attending: Emergency Medicine | Admitting: Emergency Medicine

## 2017-08-01 ENCOUNTER — Emergency Department (HOSPITAL_COMMUNITY): Payer: Self-pay

## 2017-08-01 ENCOUNTER — Encounter (HOSPITAL_COMMUNITY): Payer: Self-pay | Admitting: Emergency Medicine

## 2017-08-01 DIAGNOSIS — B349 Viral infection, unspecified: Secondary | ICD-10-CM | POA: Insufficient documentation

## 2017-08-01 DIAGNOSIS — I10 Essential (primary) hypertension: Secondary | ICD-10-CM | POA: Insufficient documentation

## 2017-08-01 DIAGNOSIS — Z91040 Latex allergy status: Secondary | ICD-10-CM | POA: Insufficient documentation

## 2017-08-01 DIAGNOSIS — Z79899 Other long term (current) drug therapy: Secondary | ICD-10-CM | POA: Insufficient documentation

## 2017-08-01 DIAGNOSIS — E039 Hypothyroidism, unspecified: Secondary | ICD-10-CM | POA: Insufficient documentation

## 2017-08-01 DIAGNOSIS — Z87891 Personal history of nicotine dependence: Secondary | ICD-10-CM | POA: Insufficient documentation

## 2017-08-01 DIAGNOSIS — J45909 Unspecified asthma, uncomplicated: Secondary | ICD-10-CM | POA: Insufficient documentation

## 2017-08-01 MED ORDER — NAPROXEN 500 MG PO TABS: 500.0000 mg | ORAL_TABLET | Freq: Two times a day (BID) | ORAL | 0 refills | Status: DC

## 2017-08-01 MED ORDER — IBUPROFEN 200 MG PO TABS
600.0000 mg | ORAL_TABLET | Freq: Once | ORAL | Status: AC
  Administered 2017-08-01: 600 mg via ORAL
  Filled 2017-08-01: qty 3

## 2017-08-01 NOTE — ED Provider Notes (Signed)
[place] DEPT Provider Note   CSN: [id number] Arrival date & time: 08/01/17  0354     History   Chief Complaint Chief Complaint  Patient presents with  . Generalized Body Aches    HPI Kimberly Ferguson is a 56 y.o. female.  HPI  56 y.o. female with a hx of Asthma, HTN, presents to the Emergency Department today due to generalized body aches for 1-2 weeks. Noted cough/congestion with subjective fevers. Notes cough and congestion have resolved, but generalized body aches have occurred. Rates pain 5/10. Aching sensation. No meds PTA. Notes in bilateral knees, as well as shoulders. Denies CP/SOB/ABD pain. No N/V/D. Notes sick contacts with niece who was diagnosed with pneumonia. No other symptoms noted   Past Medical History:  Diagnosis Date  . Allergy    SEASONAL  . Anxiety   . Arthritis    KNEES  . Asthma   . Depression   . GERD (gastroesophageal reflux disease)   . Hypertension   . Hypothyroidism   . Insomnia   . Patellofemoral syndrome, right    right knee   . Thyroid cancer ([place])   . Vitamin D deficiency     Patient Active Problem List   Diagnosis Date Noted  . Acute cholecystitis 12/30/2014  . Essential hypertension 12/30/2014  . Hypothyroidism 12/30/2014  . Papillary thyroid carcinoma, follicular variant 65/68/1275    Past Surgical History:  Procedure Laterality Date  . ABDOMINAL HYSTERECTOMY    . CESAREAN SECTION  1990  . CHOLECYSTECTOMY N/A 12/30/2014   Procedure: LAPAROSCOPIC CHOLECYSTECTOMY WITH INTRAOPERATIVE CHOLANGIOGRAM;  Surgeon: Jackolyn Confer, MD;  Location: [facility];  Service: General;  Laterality: N/A;  . THYROIDECTOMY  2012    OB History    No data available       Home Medications    Prior to Admission medications   Medication Sig Start Date End Date Taking? Authorizing Provider  albuterol (PROVENTIL HFA;VENTOLIN HFA) 108 (90 BASE) MCG/ACT inhaler Inhale 1-2 puffs into the lungs every 6 (six) hours as  needed for wheezing or shortness of breath.  01/08/15 01/08/16  [provider]  Cholecalciferol (VITAMIN D) 2000 units CAPS Take 2,000 Units by mouth daily.    [provider]  levothyroxine (SYNTHROID, LEVOTHROID) 137 MCG tablet Take 137 mcg by mouth daily before breakfast. 01/08/15 01/08/16  [provider]  lidocaine (LIDODERM) 5 % Place 1 patch onto the skin daily. Remove & Discard patch within 12 hours or as directed by MD Patient not taking: Reported on 05/25/2017 11/01/16   Joy, Helane Gunther, PA-C  loratadine-pseudoephedrine (CLARITIN-D 24-HOUR) 10-240 MG 24 hr tablet Take 1 tablet by mouth daily as needed for allergies.    [provider]  losartan-hydrochlorothiazide (HYZAAR) 100-12.5 MG per tablet Take 1 tablet by mouth daily. 01/08/15   [provider]  Nebivolol HCl 20 MG TABS Take 20 mg by mouth daily. 01/08/15   [provider]  SYNTHROID 137 MCG tablet  05/24/17   [provider]    Family History Family History  Problem Relation Age of Onset  . COPD Father   . Stomach cancer Brother   . Colon cancer Maternal Grandfather   . Prostate cancer Maternal Grandfather   . Heart disease Mother   . Hypertension Mother   . Arthritis Mother   . Cervical cancer Maternal Aunt   . Rectal cancer Neg Hx   . Liver cancer Neg Hx   . Esophageal cancer Neg Hx  Social History Social History   Tobacco Use  . Smoking status: Former Smoker    Last attempt to quit: 06/23/1979    Years since quitting: 38.1  . Smokeless tobacco: Never Used  . Tobacco comment: back in high school  Substance Use Topics  . Alcohol use: Yes    Comment: occasional  . Drug use: No     Allergies   Penicillins; Hydrocodone; Oxycodone; and Latex   Review of Systems Review of Systems ROS reviewed and all are negative for acute change except as noted in the HPI.  Physical Exam Updated Vital Signs BP (!) 153/88   Pulse 77   Temp 98.4 F (36.9 C)   Resp  16   Ht 5\' 5"  (1.651 m)   Wt 79.4 kg (175 lb)   SpO2 100%   BMI 29.12 kg/m   Physical Exam  Constitutional: She is oriented to person, place, and time. Vital signs are normal. She appears well-developed and well-nourished. No distress.  HENT:  Head: Normocephalic and atraumatic.  Right Ear: Hearing, tympanic membrane, external ear and ear canal normal.  Left Ear: Hearing, tympanic membrane, external ear and ear canal normal.  Nose: Nose normal.  Mouth/Throat: Uvula is midline, oropharynx is clear and moist and mucous membranes are normal. No trismus in the jaw. No oropharyngeal exudate, posterior oropharyngeal erythema or tonsillar abscesses.  Eyes: Conjunctivae and EOM are normal. Pupils are equal, round, and reactive to light.  Neck: Normal range of motion. Neck supple. No tracheal deviation present.  Cardiovascular: Normal rate, regular rhythm, S1 normal, S2 normal, normal heart sounds, intact distal pulses and normal pulses.  Pulmonary/Chest: Effort normal and breath sounds normal. No respiratory distress. She has no decreased breath sounds. She has no wheezes. She has no rhonchi. She has no rales.  Abdominal: Normal appearance and bowel sounds are normal. There is no tenderness.  Musculoskeletal: Normal range of motion.  Neurological: She is alert and oriented to person, place, and time.  Skin: Skin is warm and dry.  Psychiatric: She has a normal mood and affect. Her speech is normal and behavior is normal. Thought content normal.  Nursing note and vitals reviewed.  ED Treatments / Results  Labs (all labs ordered are listed, but only abnormal results are displayed) Labs Reviewed - No data to display  EKG  EKG Interpretation None       Radiology Dg Chest 2 View  Result Date: 08/01/2017 CLINICAL DATA:  Cough and body aches for 3 weeks. EXAM: CHEST  2 VIEW COMPARISON:  01/04/2016 FINDINGS: The cardiomediastinal contours are normal. Low lung volumes leading to  bronchovascular crowding. Pulmonary vasculature is normal. No consolidation, pleural effusion, or pneumothorax. No acute osseous abnormalities are seen. IMPRESSION: No acute abnormality. Electronically Signed   By: Jeb Levering M.D.   On: 08/01/2017 04:40    Procedures Procedures (including critical care time)  Medications Ordered in ED Medications - No data to display   Initial Impression / Assessment and Plan / ED Course  I have reviewed the triage vital signs and the nursing notes.  Pertinent labs & imaging results that were available during my care of the patient were reviewed by me and considered in my medical decision making (see chart for details).  Final Clinical Impressions(s) / ED Diagnoses   {I have reviewed and evaluated the relevant imaging studies.  {I have reviewed the relevant previous healthcare records.  {I obtained HPI from historian.   ED Course:  Assessment: Pt is a  56 y.o. female with a hx of Asthma, HTN, presents to the Emergency Department today due to generalized body aches for 1-2 weeks. Noted cough/congestion with subjective fevers. Notes cough and congestion have resolved, but generalized body aches have occurred. Rates pain 5/10. Aching sensation. No meds PTA. Notes in bilateral knees, as well as shoulders. Denies CP/SOB/ABD pain. No N/V/D. Notes sick contacts with niece who was diagnosed with pneumonia. On exam, pt in NAD. Nontoxic/nonseptic appearing. VSS. Afebrile. Lungs CTA. Heart RRR. Abdomen nontender soft. CXR unremarkable. Likely viral syndrome. Encouraged NSAIDs and follow up to PCP.  Plan is to DC home with follow up. Return precautions given. At time of discharge, Patient is in no acute distress. Vital Signs are stable. Patient is able to ambulate. Patient able to tolerate PO.   Disposition/Plan:  DC Home Additional Verbal discharge instructions given and discussed with patient.  Pt Instructed to f/u with PCP in the next week for evaluation and  treatment of symptoms. Return precautions given Pt acknowledges and agrees with plan  Supervising Physician Jola Schmidt, MD  Final diagnoses:  Viral syndrome    ED Discharge Orders    None       Shary Decamp, PA-C 08/01/17 [place], MD 08/01/17 [phone]

## 2017-08-01 NOTE — Discharge Instructions (Signed)
Please read and follow all provided instructions.  Your diagnoses today include:  1. Viral syndrome     Tests performed today include: Vital signs. See below for your results today.   Medications prescribed:  Take as prescribed   Home care instructions:  Follow any educational materials contained in this packet.  Follow-up instructions: Please follow-up with your primary care provider for further evaluation of symptoms and treatment   Return instructions:  Please return to the Emergency Department if you do not get better, if you get worse, or new symptoms OR  - Fever (temperature greater than 101.59F)  - Bleeding that does not stop with holding pressure to the area    -Severe pain (please note that you may be more sore the day after your accident)  - Chest Pain  - Difficulty breathing  - Severe nausea or vomiting  - Inability to tolerate food and liquids  - Passing out  - Skin becoming red around your wounds  - Change in mental status (confusion or lethargy)  - New numbness or weakness    Please return if you have any other emergent concerns.  Additional Information:  Your vital signs today were: BP (!) 153/88    Pulse 77    Temp 98.4 F (36.9 C)    Resp 16    Ht 5\' 5"  (1.651 m)    Wt 79.4 kg (175 lb)    SpO2 100%    BMI 29.12 kg/m  If your blood pressure (BP) was elevated above 135/85 this visit, please have this repeated by your doctor within one month. ---------------

## 2017-08-01 NOTE — ED Triage Notes (Signed)
Pt reports having body aches that has been ongoing for the last several weeks.

## 2017-10-04 ENCOUNTER — Institutional Professional Consult (permissible substitution): Payer: Managed Care, Other (non HMO) | Admitting: Neurology

## 2017-10-25 ENCOUNTER — Institutional Professional Consult (permissible substitution): Payer: Self-pay | Admitting: Neurology

## 2017-10-26 ENCOUNTER — Other Ambulatory Visit: Payer: Self-pay | Admitting: Family Medicine

## 2017-10-26 DIAGNOSIS — Z1231 Encounter for screening mammogram for malignant neoplasm of breast: Secondary | ICD-10-CM

## 2017-11-15 ENCOUNTER — Ambulatory Visit: Payer: No Typology Code available for payment source

## 2017-12-18 ENCOUNTER — Ambulatory Visit
Admission: RE | Admit: 2017-12-18 | Discharge: 2017-12-18 | Disposition: A | Discharge status: Home / Self Care | Payer: No Typology Code available for payment source | Source: Ambulatory Visit | Attending: Family Medicine | Admitting: Family Medicine

## 2017-12-18 DIAGNOSIS — Z1231 Encounter for screening mammogram for malignant neoplasm of breast: Secondary | ICD-10-CM

## 2018-04-20 ENCOUNTER — Encounter (HOSPITAL_COMMUNITY): Payer: Self-pay

## 2018-04-20 ENCOUNTER — Other Ambulatory Visit: Payer: Self-pay

## 2018-04-20 ENCOUNTER — Observation Stay (HOSPITAL_COMMUNITY)
Admission: EM | Admit: 2018-04-20 | Discharge: 2018-04-21 | Disposition: A | Discharge status: Home / Self Care | Payer: PRIVATE HEALTH INSURANCE | Attending: Student in an Organized Health Care Education/Training Program | Admitting: Student in an Organized Health Care Education/Training Program

## 2018-04-20 ENCOUNTER — Emergency Department (HOSPITAL_COMMUNITY): Payer: PRIVATE HEALTH INSURANCE

## 2018-04-20 DIAGNOSIS — Z91040 Latex allergy status: Secondary | ICD-10-CM | POA: Diagnosis not present

## 2018-04-20 DIAGNOSIS — I10 Essential (primary) hypertension: Secondary | ICD-10-CM | POA: Diagnosis not present

## 2018-04-20 DIAGNOSIS — Z87891 Personal history of nicotine dependence: Secondary | ICD-10-CM | POA: Diagnosis not present

## 2018-04-20 DIAGNOSIS — Z79890 Hormone replacement therapy (postmenopausal): Secondary | ICD-10-CM | POA: Diagnosis not present

## 2018-04-20 DIAGNOSIS — Z79899 Other long term (current) drug therapy: Secondary | ICD-10-CM | POA: Diagnosis not present

## 2018-04-20 DIAGNOSIS — R0789 Other chest pain: Principal | ICD-10-CM | POA: Insufficient documentation

## 2018-04-20 DIAGNOSIS — R079 Chest pain, unspecified: Secondary | ICD-10-CM | POA: Diagnosis present

## 2018-04-20 DIAGNOSIS — Z85850 Personal history of malignant neoplasm of thyroid: Secondary | ICD-10-CM | POA: Diagnosis not present

## 2018-04-20 DIAGNOSIS — E039 Hypothyroidism, unspecified: Secondary | ICD-10-CM | POA: Diagnosis not present

## 2018-04-20 DIAGNOSIS — J45909 Unspecified asthma, uncomplicated: Secondary | ICD-10-CM | POA: Diagnosis not present

## 2018-04-20 DIAGNOSIS — E890 Postprocedural hypothyroidism: Secondary | ICD-10-CM | POA: Diagnosis not present

## 2018-04-20 LAB — CBC
HEMATOCRIT: 41.2 % (ref 36.0–46.0)
Hemoglobin: 13.4 g/dL (ref 12.0–15.0)
MCH: 28.2 pg (ref 26.0–34.0)
MCHC: 32.5 g/dL (ref 30.0–36.0)
MCV: 86.6 fL (ref 78.0–100.0)
PLATELETS: 227 10*3/uL (ref 150–400)
RBC: 4.76 MIL/uL (ref 3.87–5.11)
RDW: 13.1 % (ref 11.5–15.5)
WBC: 3.4 10*3/uL — ABNORMAL LOW (ref 4.0–10.5)

## 2018-04-20 LAB — LIPID PANEL
CHOLESTEROL: 193 mg/dL (ref 0–200)
HDL: 48 mg/dL (ref >40)
LDL Cholesterol: 126 mg/dL — ABNORMAL HIGH (ref 0–99)
Total CHOL/HDL Ratio: 4 RATIO
Triglycerides: 93 mg/dL (ref <150)
VLDL: 19 mg/dL (ref 0–40)

## 2018-04-20 LAB — BASIC METABOLIC PANEL
Anion gap: 11 (ref 5–15)
BUN: 9 mg/dL (ref 6–20)
CHLORIDE: 104 mmol/L (ref 98–111)
CO2: 25 mmol/L (ref 22–32)
CREATININE: 1.03 mg/dL — AB (ref 0.44–1.00)
Calcium: 9.6 mg/dL (ref 8.9–10.3)
GFR calc Af Amer: >60 mL/min (ref >60)
GFR calc non Af Amer: 59 mL/min — ABNORMAL LOW (ref >60)
Glucose, Bld: 103 mg/dL — ABNORMAL HIGH (ref 70–99)
POTASSIUM: 3.5 mmol/L (ref 3.5–5.1)
Sodium: 140 mmol/L (ref 135–145)

## 2018-04-20 LAB — TROPONIN I

## 2018-04-20 LAB — TSH: TSH: 0.211 u[IU]/mL — ABNORMAL LOW (ref 0.350–4.500)

## 2018-04-20 LAB — I-STAT TROPONIN, ED: Troponin i, poc: 0 ng/mL (ref 0.00–0.08)

## 2018-04-20 LAB — HEMOGLOBIN A1C
Hgb A1c MFr Bld: 5.5 % (ref 4.8–5.6)
Mean Plasma Glucose: 111.15 mg/dL

## 2018-04-20 MED ORDER — ACETAMINOPHEN 325 MG PO TABS
650.0000 mg | ORAL_TABLET | ORAL | Status: DC | PRN
  Administered 2018-04-20: 650 mg via ORAL
  Filled 2018-04-20: qty 2

## 2018-04-20 MED ORDER — LOSARTAN POTASSIUM-HCTZ 100-12.5 MG PO TABS: 1.0000 | ORAL_TABLET | Freq: Every day | ORAL | Status: DC

## 2018-04-20 MED ORDER — NITROGLYCERIN 0.4 MG SL SUBL
0.4000 mg | SUBLINGUAL_TABLET | SUBLINGUAL | Status: DC | PRN
  Administered 2018-04-20: 0.4 mg via SUBLINGUAL
  Filled 2018-04-20: qty 1

## 2018-04-20 MED ORDER — NEBIVOLOL HCL 10 MG PO TABS
20.0000 mg | ORAL_TABLET | Freq: Every day | ORAL | Status: DC
  Administered 2018-04-21: 20 mg via ORAL
  Filled 2018-04-20: qty 4
  Filled 2018-04-20: qty 2

## 2018-04-20 MED ORDER — ENOXAPARIN SODIUM 40 MG/0.4ML ~~LOC~~ SOLN: 40.0000 mg | SUBCUTANEOUS | Status: DC

## 2018-04-20 MED ORDER — IBUPROFEN 400 MG PO TABS
400.0000 mg | ORAL_TABLET | Freq: Once | ORAL | Status: AC | PRN
  Administered 2018-04-20: 400 mg via ORAL
  Filled 2018-04-20: qty 1

## 2018-04-20 MED ORDER — HYDROCHLOROTHIAZIDE 12.5 MG PO CAPS
12.5000 mg | ORAL_CAPSULE | Freq: Every day | ORAL | Status: DC
  Administered 2018-04-21: 12.5 mg via ORAL
  Filled 2018-04-20: qty 1

## 2018-04-20 MED ORDER — LOSARTAN POTASSIUM 50 MG PO TABS
100.0000 mg | ORAL_TABLET | Freq: Every day | ORAL | Status: DC
  Administered 2018-04-21: 100 mg via ORAL
  Filled 2018-04-20: qty 2

## 2018-04-20 MED ORDER — ASPIRIN 81 MG PO CHEW
324.0000 mg | CHEWABLE_TABLET | Freq: Once | ORAL | Status: AC
  Administered 2018-04-20: 324 mg via ORAL
  Filled 2018-04-20: qty 4

## 2018-04-20 MED ORDER — ONDANSETRON HCL 4 MG/2ML IJ SOLN: 4.0000 mg | Freq: Four times a day (QID) | INTRAMUSCULAR | Status: DC | PRN

## 2018-04-20 MED ORDER — LEVOTHYROXINE SODIUM 137 MCG PO TABS
137.0000 ug | ORAL_TABLET | Freq: Every day | ORAL | Status: DC
  Administered 2018-04-21: 137 ug via ORAL
  Filled 2018-04-20: qty 1

## 2018-04-20 MED ORDER — ALBUTEROL SULFATE (2.5 MG/3ML) 0.083% IN NEBU: 3.0000 mL | INHALATION_SOLUTION | Freq: Four times a day (QID) | RESPIRATORY_TRACT | Status: DC | PRN

## 2018-04-20 MED ORDER — ENOXAPARIN SODIUM 40 MG/0.4ML ~~LOC~~ SOLN
40.0000 mg | SUBCUTANEOUS | Status: DC
  Administered 2018-04-20: 40 mg via SUBCUTANEOUS
  Filled 2018-04-20: qty 0.4

## 2018-04-20 NOTE — H&P (Signed)
Date: 04/20/2018               Patient Name:  Kimberly Ferguson MRN: [id number]  DOB: 12-May-1961 Age / Sex: 57 y.o., female   PCP: Bernerd Limbo, MD         Medical Service: Internal Medicine Teaching Service         Attending Physician: Dr. Oval Linsey, MD    First Contact: Dr. Truman Hayward Pager: [phone]  Second Contact: Dr. Tarri Abernethy Pager: [phone]       After Hours (After 5p/  First Contact Pager: [phone]  weekends / holidays): Second Contact Pager: [phone]   Chief Complaint: Chest Pain  History of Present Illness: Kimberly Ferguson is a 56 yo F w/ PMH of hyperthyroidism s/p thyroidectomy in 2012 and hypertension presenting to the ED with chief complaint of chest pain. She was in her usual state of health until 04/17/18 when she began to experience constant chest discomfort described as tightness and pressure. She initially thought her pain was musculoskeletal in nature and did not concern herself as she was moving heavy boxes recently. Her pain became significantly worse on 04/20/18 morning accompanied with nausea and sweats and she was concerned about possible heart attack as her mother has had history of heart attacks in the past. She mentions no inciting event, pain was noticed when she woke up in the morning. Does not worsen with exertion or improve with rest. She denies any concurrent heart burn. She also states she takes her prescribed Synthroid regularly without missing or having additional dose. Denies any F/N/V/D/C  In the ED, she received sublingual nitro 1 tab, ASA 324mg , and ibuprofen 400mg  and noticed significant improvement in her pain but continuing to endorse mild pain.   Meds:  Current Facility-Administered Medications for the 04/20/18 encounter [facility] Encounter)  Medication  . 0.9 %  sodium chloride infusion   Current Meds  Medication Sig  . albuterol (PROVENTIL HFA;VENTOLIN HFA) 108 (90 BASE) MCG/ACT inhaler Inhale 1-2 puffs into the lungs every 6 (six) hours as needed  for wheezing or shortness of breath.   . Cholecalciferol (VITAMIN D) 2000 units CAPS Take 2,000 Units by mouth daily.  . fluticasone (FLONASE) 50 MCG/ACT nasal spray Place 2 sprays into both nostrils as needed for allergies or rhinitis.  Marland Kitchen ibuprofen (ADVIL,MOTRIN) 200 MG tablet Take 400 mg by mouth as needed for moderate pain.  Marland Kitchen levothyroxine (SYNTHROID, LEVOTHROID) 137 MCG tablet Take 137 mcg by mouth daily before breakfast.  . loratadine-pseudoephedrine (CLARITIN-D 24-HOUR) 10-240 MG 24 hr tablet Take 1 tablet by mouth daily as needed for allergies.  Marland Kitchen losartan-hydrochlorothiazide (HYZAAR) 100-12.5 MG per tablet Take 1 tablet by mouth daily.  . Multiple Vitamin (MULTIVITAMIN WITH MINERALS) TABS tablet Take 1 tablet by mouth daily.  . Nebivolol HCl 20 MG TABS Take 20 mg by mouth daily.  Marland Kitchen SYNTHROID 137 MCG tablet    Allergies: Allergies as of 04/20/2018 - Review Complete 04/20/2018  Allergen Reaction Noted  . Penicillins Anaphylaxis 10/25/2011  . Hydrocodone Nausea And Vomiting 02/23/2013  . Oxycodone Nausea And Vomiting 01/30/2015  . Latex Swelling and Rash 02/08/2012   Past Medical History:  Diagnosis Date  . Allergy    SEASONAL  . Anxiety   . Arthritis    KNEES  . Asthma   . Depression   . GERD (gastroesophageal reflux disease)   . Hypertension   . Hypothyroidism   . Insomnia   . Patellofemoral syndrome, right    right knee   .  Thyroid cancer ([place])   . Vitamin D deficiency     Family History:  Mother with heart disease diagnosed in her 83s. No other relevant family hx  Social History: Works as Passenger transport manager, no significant alcohol, tobacco, or IVDU.   Review of Systems: A complete ROS was negative except as per HPI.  Physical Exam: Blood pressure (!) 152/80, pulse (!) 57, temperature 98.9 F (37.2 C), temperature source Oral, resp. rate 19, height 5\' 5"  (1.651 m), weight 86.2 kg, SpO2 100 %. Physical Exam  Constitutional: She is oriented to person, place, and  time and well-developed, well-nourished, and in no distress. No distress.  HENT:  Mouth/Throat: Oropharynx is clear and moist. No oropharyngeal exudate.  Eyes: Pupils are equal, round, and reactive to light. Conjunctivae and EOM are normal. No scleral icterus.  Neck: Normal range of motion. Neck supple. No JVD present. No thyromegaly present.  Cardiovascular: Normal rate, regular rhythm, normal heart sounds and intact distal pulses.  Point tenderness to palpation on mid sternum  Pulmonary/Chest: Effort normal and breath sounds normal. No respiratory distress.  Abdominal: Soft. Bowel sounds are normal. She exhibits no distension. There is no tenderness. There is no guarding.  Musculoskeletal: Normal range of motion. She exhibits no edema or tenderness.  Lymphadenopathy:    She has no cervical adenopathy.  Neurological: She is alert and oriented to person, place, and time. No cranial nerve deficit.  Skin: Skin is warm and dry. No rash noted. She is not diaphoretic. No erythema.   EKG: personally reviewed my interpretation is Normal sinus rhythm. T-wave inversions on II,III,AvF. No ST elevation or depression.   CXR: personally reviewed my interpretation is no obvious pulmonary edema. Normal cardiac silhouette. No lobar consolidation/hemothorax/pneumonthorax.  Assessment & Plan by Problem: Active Problems:   Chest pain Chest pain 2/2 msk vs reflux vs angina Kimberly Ferguson w/ PMH of HTN and hypothyroidism on synthroid presenting with progressively worsening chest pain. Her physical exam show point tenderness to palpation. T wave inversions on EKG is concerning but trops negative so far. Most likely musculoskeletal origin but intermediate risk for CAD Will admit for observation and discharge after ACS r/o. - Trend troponin x3 q6hrs - Tylenol 650mg  q4h PRN for pain - F/u with outpatient stress test  Hypertension Currently elevated 152/80. Most likely elevated for acute pain - C/w home meds:  nebivolHCI 20mg  daily, Losartan-HCTZ 100-12.5mg  daily  Hypothyroidism TSH at 0.211. Home regimen may need adjustment after discharge - C/w home regimen synthroid 137 mcg  Diet: Regular DVT prophylaxis: Lovenox 40mg  Code: FULL   Dispo: Admit patient to Observation with expected length of stay less than 2 midnights.  Signed: Mosetta Anis, MD 04/20/2018, 2:44 PM  Pager: [phone]

## 2018-04-20 NOTE — ED Notes (Signed)
Pt ambulatory to bathroom. States CP is about a 4 now and doesn't want further nitro at this time.

## 2018-04-20 NOTE — ED Triage Notes (Signed)
Pt comes in c.o central chest tightness that started a week ago and radiates to her right side.  Pt also nauseated and SOB. States she used her inhaler this morning without relief. Pain 7/10. Pt also endorsing a headache. Pt a.o, nad

## 2018-04-20 NOTE — ED Notes (Signed)
Pt given Sprite 

## 2018-04-20 NOTE — ED Provider Notes (Signed)
[facility] EMERGENCY DEPARTMENT Provider Note   CSN: [id number] Arrival date & time: 04/20/18  1013     History   Chief Complaint Chief Complaint  Patient presents with  . Chest Pain    HPI Kimberly Ferguson is a 57 y.o. female.  Patient is a 57 year old female with past medical history of hypothyroidism and hypertension resenting with worsening chest pain x1 day.  Patient reports that she has been having chest pain for the past week but today it became worse.  She states initial chest pain started after she was lifting heavy objects while moving items in her shed on Monday.  States that this pain today was different.  Says that it felt as if someone was squeezing her chest and sitting on top of it.  Says it was a very tightening feeling.  Patient reports the pain radiates to her neck and upper back as well.  Patient does report associated shortness of breath and nausea with chest pain.  Patient also had associated headache in her temporal regions which she describes as pounding in nature.  Denies any vision changes, dizziness, lightheadedness, loss of consciousness.  She also endorses palpitations earlier today but no longer has any.  Patient reports right leg pain that is chronic the past 2 weeks but has worsened today.  Patient recently had PCP checkup in April and says she was told she was healthy and had normal thyroid function at that time.  Patient does report new stressor this week when her daughter got into a car accident.  Patient has a family history of cardiac disease in her mother, mother did not have a heart attack but did have a bypass surgery.  She reports no medication changes and is compliant on all her medications.  Does report that pain is somewhat diminished now.     Past Medical History:  Diagnosis Date  . Allergy    SEASONAL  . Anxiety   . Arthritis    KNEES  . Asthma   . Depression   . GERD (gastroesophageal reflux disease)   . Hypertension     . Hypothyroidism   . Insomnia   . Patellofemoral syndrome, right    right knee   . Thyroid cancer ([place])   . Vitamin D deficiency     Patient Active Problem List   Diagnosis Date Noted  . Chest pain 04/20/2018  . Acute cholecystitis 12/30/2014  . Essential hypertension 12/30/2014  . Hypothyroidism 12/30/2014  . Papillary thyroid carcinoma, follicular variant 23/76/2831    Past Surgical History:  Procedure Laterality Date  . ABDOMINAL HYSTERECTOMY    . CESAREAN SECTION  1990  . CHOLECYSTECTOMY N/A 12/30/2014   Procedure: LAPAROSCOPIC CHOLECYSTECTOMY WITH INTRAOPERATIVE CHOLANGIOGRAM;  Surgeon: Jackolyn Confer, MD;  Location: [facility] ORS;  Service: General;  Laterality: N/A;  . THYROIDECTOMY  2012     OB History   None      Home Medications    Prior to Admission medications   Medication Sig Start Date End Date Taking? Authorizing Provider  albuterol (PROVENTIL HFA;VENTOLIN HFA) 108 (90 BASE) MCG/ACT inhaler Inhale 1-2 puffs into the lungs every 6 (six) hours as needed for wheezing or shortness of breath.  01/08/15 04/20/18 Yes [provider]  Cholecalciferol (VITAMIN D) 2000 units CAPS Take 2,000 Units by mouth daily.   Yes [provider]  fluticasone (FLONASE) 50 MCG/ACT nasal spray Place 2 sprays into both nostrils as needed for allergies or rhinitis.   Yes [provider]  ibuprofen (ADVIL,MOTRIN) 200 MG tablet Take 400 mg by mouth as needed for moderate pain.   Yes [provider]  levothyroxine (SYNTHROID, LEVOTHROID) 137 MCG tablet Take 137 mcg by mouth daily before breakfast. 01/08/15 04/20/18 Yes [provider]  loratadine-pseudoephedrine (CLARITIN-D 24-HOUR) 10-240 MG 24 hr tablet Take 1 tablet by mouth daily as needed for allergies.   Yes [provider]  losartan-hydrochlorothiazide (HYZAAR) 100-12.5 MG per tablet Take 1 tablet by mouth daily. 01/08/15  Yes [provider]  Multiple Vitamin (MULTIVITAMIN WITH  MINERALS) TABS tablet Take 1 tablet by mouth daily.   Yes [provider]  Nebivolol HCl 20 MG TABS Take 20 mg by mouth daily. 01/08/15  Yes [provider]  SYNTHROID 137 MCG tablet  05/24/17  Yes [provider]  lidocaine (LIDODERM) 5 % Place 1 patch onto the skin daily. Remove & Discard patch within 12 hours or as directed by MD Patient not taking: Reported on 05/25/2017 11/01/16   Joy, Shawn C, PA-C  naproxen (NAPROSYN) 500 MG tablet Take 1 tablet (500 mg total) by mouth 2 (two) times daily. Patient not taking: Reported on 04/20/2018 08/01/17   Shary Decamp, PA-C    Family History Family History  Problem Relation Age of Onset  . COPD Father   . Stomach cancer Brother   . Colon cancer Maternal Grandfather   . Prostate cancer Maternal Grandfather   . Heart disease Mother   . Hypertension Mother   . Arthritis Mother   . Cervical cancer Maternal Aunt   . Rectal cancer Neg Hx   . Liver cancer Neg Hx   . Esophageal cancer Neg Hx     Social History Social History   Tobacco Use  . Smoking status: Former Smoker    Last attempt to quit: 06/23/1979    Years since quitting: 38.8  . Smokeless tobacco: Never Used  . Tobacco comment: back in high school  Substance Use Topics  . Alcohol use: Yes    Comment: occasional  . Drug use: No     Allergies   Penicillins; Hydrocodone; Oxycodone; and Latex   Review of Systems Review of Systems  Respiratory: Positive for chest tightness and shortness of breath.   Cardiovascular: Positive for chest pain and palpitations. Negative for leg swelling.  Gastrointestinal: Positive for nausea.  Musculoskeletal: Positive for back pain, myalgias and neck pain. Negative for gait problem.  Neurological: Positive for headaches. Negative for dizziness, syncope and light-headedness.     Physical Exam Updated Vital Signs BP 131/81   Pulse (!) 59   Temp 98.9 F (37.2 C) (Oral)   Resp 19   Ht 5\' 5"  (1.651 m)   Wt 86.2 kg    SpO2 100%   BMI 31.62 kg/m   Physical Exam  Constitutional: She does not appear ill. No distress.  HENT:  Head: Normocephalic.  Eyes: Pupils are equal, round, and reactive to light. EOM are normal.  Neck: Neck supple. No thyromegaly present.  Cardiovascular: Normal rate, regular rhythm, intact distal pulses and normal pulses. Exam reveals no gallop and no friction rub.  No murmur heard. Pulses:      Radial pulses are 2+ on the right side, and 2+ on the left side.       Dorsalis pedis pulses are 2+ on the right side, and 2+ on the left side.       Posterior tibial pulses are 2+ on the right side, and 2+ on the left  side.  Chest pain not reproducible on exam.  Pulmonary/Chest: Effort normal and breath sounds normal. No accessory muscle usage. No respiratory distress. She has no decreased breath sounds. She has no wheezes. She has no rhonchi. She has no rales.  Abdominal: Soft. She exhibits no distension, no ascites and no mass. There is no splenomegaly or hepatomegaly. There is tenderness. There is no rebound and no guarding.  Tenderness to palpation in epigastric region radiating to right upper quadrant.  Musculoskeletal:       Right lower leg: She exhibits tenderness. She exhibits no edema.       Left lower leg: Normal. She exhibits no tenderness and no edema.  Range of motion somewhat limited in right lower extremity secondary to pain.  Tenderness to palpation of back of right thigh.  No calf tenderness bilaterally.  Lymphadenopathy:    She has no cervical adenopathy.  Neurological: She is alert. A sensory deficit is present. No cranial nerve deficit. She exhibits normal muscle tone.  Sensation diminished on right upper extremity Diminished grip strength of right hand Decreased strength of right lower extremity secondary to pain  Skin: Skin is warm. No rash noted.  Psychiatric: She has a normal mood and affect.     ED Treatments / Results  Labs (all labs ordered are listed, but  only abnormal results are displayed) Labs Reviewed  BASIC METABOLIC PANEL - Abnormal; Notable for the following components:      Result Value   Glucose, Bld 103 (*)    Creatinine, Ser 1.03 (*)    GFR calc non Af Amer 59 (*)    All other components within normal limits  CBC - Abnormal; Notable for the following components:   WBC 3.4 (*)    All other components within normal limits  I-STAT TROPONIN, ED    EKG EKG Interpretation  Date/Time:  Friday April 20 2018 10:19:19 EDT Ventricular Rate:  83 PR Interval:  156 QRS Duration: 84 QT Interval:  362 QTC Calculation: 425 R Axis:   8 Text Interpretation:  Normal sinus rhythm Possible Left atrial enlargement Nonspecific T wave abnormality Abnormal ECG No STEMI.  Confirmed by Nanda Quinton [phone]) on 04/20/2018 12:11:39 PM   Radiology Dg Chest 2 View  Result Date: 04/20/2018 CLINICAL DATA:  Chest pain EXAM: CHEST - 2 VIEW COMPARISON:  08/01/2017 FINDINGS: The heart size and mediastinal contours are within normal limits. Both lungs are clear. The visualized skeletal structures are unremarkable. IMPRESSION: No active cardiopulmonary disease. Electronically Signed   By: Kathreen Devoid   On: 04/20/2018 11:02    Procedures Procedures (including critical care time)  Medications Ordered in ED Medications  nitroGLYCERIN (NITROSTAT) SL tablet 0.4 mg (0.4 mg Sublingual Given 04/20/18 1259)  ibuprofen (ADVIL,MOTRIN) tablet 400 mg (400 mg Oral Given 04/20/18 1028)  aspirin chewable tablet 324 mg (324 mg Oral Given 04/20/18 1259)     Initial Impression / Assessment and Plan / ED Course  I have reviewed the triage vital signs and the nursing notes.  Pertinent labs & imaging results that were available during my care of the patient were reviewed by me and considered in my medical decision making (see chart for details).     Patient is a 57 year old female with past medical history of hypothyroidism and hypertension presenting with worsening  chest pain x1 day.  Chest pain is described as a "tightening" or squeezing feeling.  Initial troponin is negative.  EKG does show some inferior lead T wave inversions however  no STEMI noted.  Possibly some left atrial enlargement.  HEART score of 4 on presentation due to age, past medical history of hypertension, family history of heart disease, and T wave inversions on EKG.  Given these findings patient will likely need consult with cardiology and possible admission for observation to rule out ACS.  We will plan to give patient 1 dose of aspirin and nitroglycerin here in the emergency department.  2:12PM: Patient reports that chest pain is slightly improved after nitroglycerin.  Pain is now 5 out of 10 but continues to have chest pressure.  Given that patient has multiple risk factors and continued chest pain we will plan to admit for ACS.  Have spoken to the hospitalist on-call who accepts patient for admission.  This patient with Dr. Laverta Baltimore who independently examined patient and agrees with plan. Final Clinical Impressions(s) / ED Diagnoses   Final diagnoses:  Chest pain, unspecified type    ED Discharge Orders    None       Caroline More, DO 04/20/18 1416    Long, [facility], MD 04/20/18 2325

## 2018-04-20 NOTE — ED Notes (Signed)
Pt has give urine sample it is sitting at the bedside.

## 2018-04-20 NOTE — ED Notes (Signed)
Pt declining 2nd nitro

## 2018-04-20 NOTE — ED Notes (Signed)
Resident paged to notify that a cardiac tele bed request is needed instead of Med surg due to active CP.

## 2018-04-21 ENCOUNTER — Encounter (HOSPITAL_COMMUNITY): Payer: Self-pay | Admitting: Internal Medicine

## 2018-04-21 DIAGNOSIS — Z885 Allergy status to narcotic agent status: Secondary | ICD-10-CM

## 2018-04-21 DIAGNOSIS — R072 Precordial pain: Secondary | ICD-10-CM | POA: Diagnosis not present

## 2018-04-21 DIAGNOSIS — Z91040 Latex allergy status: Secondary | ICD-10-CM

## 2018-04-21 DIAGNOSIS — E890 Postprocedural hypothyroidism: Secondary | ICD-10-CM | POA: Diagnosis not present

## 2018-04-21 DIAGNOSIS — I10 Essential (primary) hypertension: Secondary | ICD-10-CM | POA: Diagnosis not present

## 2018-04-21 DIAGNOSIS — Z79890 Hormone replacement therapy (postmenopausal): Secondary | ICD-10-CM | POA: Diagnosis not present

## 2018-04-21 DIAGNOSIS — R0789 Other chest pain: Secondary | ICD-10-CM

## 2018-04-21 DIAGNOSIS — Z79899 Other long term (current) drug therapy: Secondary | ICD-10-CM

## 2018-04-21 DIAGNOSIS — Z8249 Family history of ischemic heart disease and other diseases of the circulatory system: Secondary | ICD-10-CM

## 2018-04-21 DIAGNOSIS — Z880 Allergy status to penicillin: Secondary | ICD-10-CM

## 2018-04-21 LAB — BASIC METABOLIC PANEL
Anion gap: 8 (ref 5–15)
BUN: 13 mg/dL (ref 6–20)
CHLORIDE: 106 mmol/L (ref 98–111)
CO2: 26 mmol/L (ref 22–32)
CREATININE: 1.07 mg/dL — AB (ref 0.44–1.00)
Calcium: 9.1 mg/dL (ref 8.9–10.3)
GFR calc Af Amer: >60 mL/min (ref >60)
GFR calc non Af Amer: 57 mL/min — ABNORMAL LOW (ref >60)
GLUCOSE: 97 mg/dL (ref 70–99)
Potassium: 3.4 mmol/L — ABNORMAL LOW (ref 3.5–5.1)
Sodium: 140 mmol/L (ref 135–145)

## 2018-04-21 LAB — TROPONIN I: Troponin I: <0.03 ng/mL (ref <0.03)

## 2018-04-21 LAB — HIV ANTIBODY (ROUTINE TESTING W REFLEX): HIV Screen 4th Generation wRfx: NONREACTIVE

## 2018-04-21 MED ORDER — POTASSIUM CHLORIDE CRYS ER 20 MEQ PO TBCR
40.0000 meq | EXTENDED_RELEASE_TABLET | Freq: Once | ORAL | Status: AC
  Administered 2018-04-21: 40 meq via ORAL
  Filled 2018-04-21: qty 2

## 2018-04-21 MED ORDER — RAMELTEON 8 MG PO TABS
8.0000 mg | ORAL_TABLET | Freq: Every day | ORAL | Status: DC
  Administered 2018-04-21: 8 mg via ORAL
  Filled 2018-04-21: qty 1

## 2018-04-21 NOTE — Consult Note (Signed)
Cardiology Consultation:   Patient ID: PORSCHE NOGUCHI; [id number]; [birth year]   Admit date: 04/20/2018 Date of Consult: 04/21/2018  Primary Care Provider: Bernerd Limbo, MD Primary Cardiologist: Buford Dresser (new)   Patient Profile:   Kimberly Ferguson is a 57 y.o. female with a hx of hyperthyroidism s/p thyroidectomy and hypertension who is being seen today for the evaluation of chest pain at the request of Dr. Evette Doffing.  History of Present Illness:   Ms. Brabender does not have a prior PMH of cardiac disease. She has been feeling well until earlier in the week when she had a tightness in her chest. She had been moving boxes in her storage unit and thought it was due to MSK pain. However, yesterday morning the pain became much worse, and she presented to the ER for further evaluation. The pain does not radiate, is not associated with exertion, not associated with shortness of breath. Has been largely constant. Not positional. Worse when she presses on her chest.  Pertinent risk factors: mother had history of heart issues in her 87s. No other significant FH of CAD. SH: no tobacco No known diabetes, lipids done this admission (below)  This morning she states that she feels well, and her chest pain is only present when she presses on her sternum. She has no other concerns.  Past Medical History:  Diagnosis Date  . Allergy    SEASONAL  . Anxiety   . Arthritis    KNEES  . Asthma   . Depression   . GERD (gastroesophageal reflux disease)   . Hypertension   . Hypothyroidism   . Insomnia   . Patellofemoral syndrome, right    right knee   . Thyroid cancer ([place])   . Vitamin D deficiency     Past Surgical History:  Procedure Laterality Date  . ABDOMINAL HYSTERECTOMY    . CESAREAN SECTION  1990  . CHOLECYSTECTOMY N/A 12/30/2014   Procedure: LAPAROSCOPIC CHOLECYSTECTOMY WITH INTRAOPERATIVE CHOLANGIOGRAM;  Surgeon: Jackolyn Confer, MD;  Location: [facility];  Service: General;   Laterality: N/A;  . THYROIDECTOMY  2012     Home Medications:  Prior to Admission medications   Medication Sig Start Date End Date Taking? Authorizing Provider  albuterol (PROVENTIL HFA;VENTOLIN HFA) 108 (90 BASE) MCG/ACT inhaler Inhale 1-2 puffs into the lungs every 6 (six) hours as needed for wheezing or shortness of breath.  01/08/15 04/20/18 Yes [provider]  Cholecalciferol (VITAMIN D) 2000 units CAPS Take 2,000 Units by mouth daily.   Yes [provider]  fluticasone (FLONASE) 50 MCG/ACT nasal spray Place 2 sprays into both nostrils as needed for allergies or rhinitis.   Yes [provider]  ibuprofen (ADVIL,MOTRIN) 200 MG tablet Take 400 mg by mouth as needed for moderate pain.   Yes [provider]  levothyroxine (SYNTHROID, LEVOTHROID) 137 MCG tablet Take 137 mcg by mouth daily before breakfast. 01/08/15 04/20/18 Yes [provider]  loratadine-pseudoephedrine (CLARITIN-D 24-HOUR) 10-240 MG 24 hr tablet Take 1 tablet by mouth daily as needed for allergies.   Yes [provider]  losartan-hydrochlorothiazide (HYZAAR) 100-12.5 MG per tablet Take 1 tablet by mouth daily. 01/08/15  Yes [provider]  Multiple Vitamin (MULTIVITAMIN WITH MINERALS) TABS tablet Take 1 tablet by mouth daily.   Yes [provider]  Nebivolol HCl 20 MG TABS Take 20 mg by mouth daily. 01/08/15  Yes [provider]  SYNTHROID 137 MCG tablet  05/24/17  Yes [provider]  lidocaine (  LIDODERM) 5 % Place 1 patch onto the skin daily. Remove & Discard patch within 12 hours or as directed by MD Patient not taking: Reported on 05/25/2017 11/01/16   Joy, Shawn C, PA-C  naproxen (NAPROSYN) 500 MG tablet Take 1 tablet (500 mg total) by mouth 2 (two) times daily. Patient not taking: Reported on 04/20/2018 08/01/17   Shary Decamp, PA-C    Inpatient Medications: Scheduled Meds: . enoxaparin (LOVENOX) injection  40 mg Subcutaneous Q24H  .  losartan  100 mg Oral Daily   And  . hydrochlorothiazide  12.5 mg Oral Daily  . levothyroxine  137 mcg Oral QAC breakfast  . nebivolol  20 mg Oral Daily  . ramelteon  8 mg Oral QHS   Continuous Infusions:  PRN Meds: acetaminophen, albuterol, nitroGLYCERIN, ondansetron (ZOFRAN) IV  Allergies:    Allergies  Allergen Reactions  . Penicillins Anaphylaxis    Has patient had a PCN reaction causing immediate rash, facial/tongue/throat swelling, SOB or lightheadedness with hypotension: Yes Has patient had a PCN reaction causing severe rash involving mucus membranes or skin necrosis: No Has patient had a PCN reaction that required hospitalization Yes Has patient had a PCN reaction occurring within the last 10 years: No If all of the above answers are "NO", then may proceed with Cephalosporin use.   Marland Kitchen Hydrocodone Nausea And Vomiting  . Oxycodone Nausea And Vomiting  . Latex Swelling and Rash    Social History:   Social History   Socioeconomic History  . Marital status: Married    Spouse name: Not on file  . Number of children: 1  . Years of education: Not on file  . Highest education level: Not on file  Occupational History  . Occupation: Med Asst Building services engineer  Social Needs  . Financial resource strain: Not on file  . Food insecurity:    Worry: Not on file    Inability: Not on file  . Transportation needs:    Medical: Not on file    Non-medical: Not on file  Tobacco Use  . Smoking status: Former Smoker    Last attempt to quit: 06/23/1979    Years since quitting: 38.8  . Smokeless tobacco: Never Used  . Tobacco comment: back in high school  Substance and Sexual Activity  . Alcohol use: Yes    Comment: occasional  . Drug use: No  . Sexual activity: Not Currently  Lifestyle  . Physical activity:    Days per week: Not on file    Minutes per session: Not on file  . Stress: Not on file  Relationships  . Social connections:    Talks on phone: Not on file     Gets together: Not on file    Attends religious service: Not on file    Active member of club or organization: Not on file    Attends meetings of clubs or organizations: Not on file    Relationship status: Not on file  . Intimate partner violence:    Fear of current or ex partner: Not on file    Emotionally abused: Not on file    Physically abused: Not on file    Forced sexual activity: Not on file  Other Topics Concern  . Not on file  Social History Narrative  . Not on file    Family History:   Mother with heart disease Family History  Problem Relation Age of Onset  . COPD Father   . Stomach cancer Brother   .  Colon cancer Maternal Grandfather   . Prostate cancer Maternal Grandfather   . Heart disease Mother   . Hypertension Mother   . Arthritis Mother   . Cervical cancer Maternal Aunt   . Rectal cancer Neg Hx   . Liver cancer Neg Hx   . Esophageal cancer Neg Hx      ROS:  Please see the history of present illness.  Review of Systems  Constitution: Negative for fever.  HENT: Negative for ear pain and hearing loss.   Eyes: Negative for blurred vision and pain.  Cardiovascular: Positive for chest pain. Negative for claudication, cyanosis, dyspnea on exertion, irregular heartbeat, leg swelling, near-syncope, orthopnea, palpitations, paroxysmal nocturnal dyspnea and syncope.  Respiratory: Negative for shortness of breath.   Endocrine: Negative for cold intolerance and heat intolerance.  Hematologic/Lymphatic: Does not bruise/bleed easily.  Skin: Negative for rash.  Musculoskeletal: Positive for myalgias.  Gastrointestinal: Negative for hematochezia and melena.  Genitourinary: Negative for dysuria and hematuria.  Neurological: Negative for weakness.    All other ROS reviewed and negative.     Physical Exam/Data:   Vitals:   04/20/18 2121 04/21/18 0001 04/21/18 0426 04/21/18 0918  BP: 124/73 116/64 (!) 130/92 (!) 156/96  Pulse: 68 81 66 66  Resp: 18 16    Temp:  98.2 F (36.8 C) 98.2 F (36.8 C) 98.1 F (36.7 C) 98.2 F (36.8 C)  TempSrc: Oral Oral Oral Oral  SpO2: 98% 93% 98% 100%  Weight:   82.8 kg   Height:        Intake/Output Summary (Last 24 hours) at 04/21/2018 1019 Last data filed at 04/20/2018 1825 Gross per 24 hour  Intake 0 ml  Output 1 ml  Net -1 ml   Filed Weights   04/20/18 1023 04/20/18 1640 04/21/18 0426  Weight: 86.2 kg 82.2 kg 82.8 kg   Body mass index is 30.39 kg/m.  General:  Well nourished, well developed, in no acute distress HEENT: normal Lymph: no adenopathy Neck: no JVD Endocrine:  No thryomegaly Vascular: No carotid bruits; DP and radial pulses 2+ bilaterally Cardiac:  regular S1, S2; no murmur, rubs or gallops. Point tenderness over sternum, which she states reproduces her pain. Lungs:  clear to auscultation bilaterally, no wheezing, rhonchi or rales  Abd: soft, nontender, bowel sounds normal Ext: no edema Musculoskeletal:  No deformities, moves all limbs independently Skin: warm and dry  Neuro:  no focal abnormalities noted Psych:  Normal affect   EKG:  The EKG was personally reviewed and demonstrates:  NSR with TWI in precordial and inferior leads. Prior ECG in 2017 had flat T waves in the same leads. Telemetry:  Telemetry was personally reviewed and demonstrates:  Normal sinus rhythm  Relevant CV Studies: none  Laboratory Data:  Chemistry Recent Labs  Lab 04/20/18 1031 04/21/18 0319  NA 140 140  K 3.5 3.4*  CL 104 106  CO2 25 26  GLUCOSE 103* 97  BUN 9 13  CREATININE 1.03* 1.07*  CALCIUM 9.6 9.1  GFRNONAA 59* 57*  GFRAA >60 >60  ANIONGAP 11 8    No results for input(s): PROT, ALBUMIN, AST, ALT, ALKPHOS, BILITOT in the last 168 hours. Hematology Recent Labs  Lab 04/20/18 1031  WBC 3.4*  RBC 4.76  HGB 13.4  HCT 41.2  MCV 86.6  MCH 28.2  MCHC 32.5  RDW 13.1  PLT 227   Cardiac Enzymes Recent Labs  Lab 04/20/18 1450 04/20/18 2035 04/21/18 0319  TROPONINI <0.03 <0.03  <  0.03    Recent Labs  Lab 04/20/18 1051  TROPIPOC 0.00    BNPNo results for input(s): BNP, PROBNP in the last 168 hours.  DDimer No results for input(s): DDIMER in the last 168 hours.  Radiology/Studies:  Dg Chest 2 View  Result Date: 04/20/2018 CLINICAL DATA:  Chest pain EXAM: CHEST - 2 VIEW COMPARISON:  08/01/2017 FINDINGS: The heart size and mediastinal contours are within normal limits. Both lungs are clear. The visualized skeletal structures are unremarkable. IMPRESSION: No active cardiopulmonary disease. Electronically Signed   By: Kathreen Devoid   On: 04/20/2018 11:02    Assessment and Plan:   1. Chest pain: much improved. Mimicked by palpation on the mid sternum. Troponins negative. She does have nonspecific T waves on her ECG, but she had flat t waves in the same leads in 2017.  Given her ASCVD risk, I recommend outpatient cardiology follow up to discuss optimizing her risk factors and likely stress test given her family history and risk. We discussed outpatient management, and the patient is amenable.  The 10-year ASCVD risk score Mikey Bussing DC Brooke Bonito., et al., 2013) is: 11.3%   Values used to calculate the score:     Age: 57 years     Sex: Female     Is Non-Hispanic African American: Yes     Diabetic: No     Tobacco smoker: No     Systolic Blood Pressure: 628 mmHg     Is BP treated: Yes     HDL Cholesterol: 48 mg/dL     Total Cholesterol: 193 mg/dL   Hypertension: elevated here. Would continue her home medication regimen, and if it remains elevated we can further adjust as an outpatient.  Length of Stay:  LOS: 0 days   Buford Dresser, MD, PhD [facility]   04/21/2018, 10:19 AM  [facility] will sign off.   Medication Recommendations:  none Other recommendations (labs, testing, etc):  Will discuss outpatient stress test Follow up as an outpatient:  She can see me at the [facility] or [facility] office to further discuss her risk.  For questions  or updates, please contact [place] Please consult [email or web address] for contact info under Cardiology/STEMI.

## 2018-04-21 NOTE — Discharge Summary (Signed)
Name: Kimberly Ferguson MRN: [id number] DOB: 22-Aug-1961 57 y.o. PCP: Bernerd Limbo, MD  Date of Admission: 04/20/2018 10:19 AM Date of Discharge: 04/21/2018 Attending Physician: Axel Filler, *  Discharge Diagnosis: 1. Atypical Chest Pain  Discharge Medications: Allergies as of 04/21/2018      Reactions   Penicillins Anaphylaxis   Has patient had a PCN reaction causing immediate rash, facial/tongue/throat swelling, SOB or lightheadedness with hypotension: Yes Has patient had a PCN reaction causing severe rash involving mucus membranes or skin necrosis: No Has patient had a PCN reaction that required hospitalization Yes Has patient had a PCN reaction occurring within the last 10 years: No If all of the above answers are "NO", then may proceed with Cephalosporin use.   Hydrocodone Nausea And Vomiting   Oxycodone Nausea And Vomiting   Latex Swelling, Rash      Medication List    STOP taking these medications   lidocaine 5 % Commonly known as:  LIDODERM   naproxen 500 MG tablet Commonly known as:  NAPROSYN     TAKE these medications   albuterol 108 (90 Base) MCG/ACT inhaler Commonly known as:  PROVENTIL HFA;VENTOLIN HFA Inhale 1-2 puffs into the lungs every 6 (six) hours as needed for wheezing or shortness of breath.   fluticasone 50 MCG/ACT nasal spray Commonly known as:  FLONASE Place 2 sprays into both nostrils as needed for allergies or rhinitis.   ibuprofen 200 MG tablet Commonly known as:  ADVIL,MOTRIN Take 400 mg by mouth as needed for moderate pain.   loratadine-pseudoephedrine 10-240 MG 24 hr tablet Commonly known as:  CLARITIN-D 24-hour Take 1 tablet by mouth daily as needed for allergies.   losartan-hydrochlorothiazide 100-12.5 MG tablet Commonly known as:  HYZAAR Take 1 tablet by mouth daily.   multivitamin with minerals Tabs tablet Take 1 tablet by mouth daily.   Nebivolol HCl 20 MG Tabs Take 20 mg by mouth daily.   SYNTHROID 137 MCG  tablet Generic drug:  levothyroxine What changed:  Another medication with the same name was removed. Continue taking this medication, and follow the directions you see here.   Vitamin D 2000 units Caps Take 2,000 Units by mouth daily.      Disposition and follow-up:   Ms.Kimberly Ferguson was discharged from [facility] in Stable condition.  At the hospital follow up visit please address:  1.  Chest Pain worked up as MSK. Please confirm that the pain has resolved with NSAID use  2.  Labs / imaging needed at time of follow-up: none  3.  Pending labs/ test needing follow-up: none  Follow-up Appointments: Follow-up Information    [facility]. Go on 05/08/2018.   Specialty:  Cardiology Why:  Follow up with Dr. Buford Dresser @ 8:40am.  Contact information: [address] [place]       Bernerd Limbo, MD. Call in 2 week(s).   Specialty:  Family Medicine Why:  Please call and make a follow up with your primary care provider. Contact information: [place] [address] [phone]           Hospital Course by problem list: 1. Atypical Chest Pain: Mrs.Jarecki presented with acute onset atypical chest pain endorsing point tenderness to palpation. ACS rule out completed with troponin negative x3 and no ST changes on EKG. Non-specific T wave changes noted and auto cardio consult recommended outpatient stress testing  considering intermediate risk for ASCVD. Chest pain resolved after receiving nitroglycerin, aspirin, and ibuprofen in the ED. Patient discharged home  Discharge Vitals:   BP (!) 156/96 (BP Location: Right Arm)   Pulse 66   Temp 98.2 F (36.8 C) (Oral)   Resp 16   Ht 5\' 5"  (1.651 m)   Wt 82.8 kg   SpO2 100%   BMI 30.39 kg/m   Pertinent Labs, Studies, and Procedures:  Results for orders placed or performed during the  hospital encounter of 04/20/18 (from the past 24 hour(s))  Troponin I (q 6hr x 3)     Status: None   Collection Time: 04/20/18  2:50 PM  Result Value Ref Range   Troponin I <0.03 <0.03 ng/mL  Hemoglobin A1c     Status: None   Collection Time: 04/20/18  2:52 PM  Result Value Ref Range   Hgb A1c MFr Bld 5.5 4.8 - 5.6 %   Mean Plasma Glucose 111.15 mg/dL  Lipid panel     Status: Abnormal   Collection Time: 04/20/18  2:53 PM  Result Value Ref Range   Cholesterol 193 0 - 200 mg/dL   Triglycerides 93 <150 mg/dL   HDL 48 >40 mg/dL   Total CHOL/HDL Ratio 4.0 RATIO   VLDL 19 0 - 40 mg/dL   LDL Cholesterol 126 (H) 0 - 99 mg/dL  TSH     Status: Abnormal   Collection Time: 04/20/18  2:53 PM  Result Value Ref Range   TSH 0.211 (L) 0.350 - 4.500 uIU/mL  Troponin I (q 6hr x 3)     Status: None   Collection Time: 04/20/18  8:35 PM  Result Value Ref Range   Troponin I <0.03 <0.03 ng/mL  Troponin I (q 6hr x 3)     Status: None   Collection Time: 04/21/18  3:19 AM  Result Value Ref Range   Troponin I <0.03 <0.03 ng/mL  Basic metabolic panel     Status: Abnormal   Collection Time: 04/21/18  3:19 AM  Result Value Ref Range   Sodium 140 135 - 145 mmol/L   Potassium 3.4 (L) 3.5 - 5.1 mmol/L   Chloride 106 98 - 111 mmol/L   CO2 26 22 - 32 mmol/L   Glucose, Bld 97 70 - 99 mg/dL   BUN 13 6 - 20 mg/dL   Creatinine, Ser 1.07 (H) 0.44 - 1.00 mg/dL   Calcium 9.1 8.9 - 10.3 mg/dL   GFR calc non Af Amer 57 (L) >60 mL/min   GFR calc Af Amer >60 >60 mL/min   Anion gap 8 5 - 15   Discharge Instructions: Discharge Instructions    Call MD for:  difficulty breathing, headache or visual disturbances   Complete by:  As directed    Call MD for:  persistant dizziness or light-headedness   Complete by:  As directed    Call MD for:  persistant nausea and vomiting   Complete by:  As directed    Call MD for:  severe uncontrolled pain   Complete by:  As directed    Diet - low sodium heart healthy   Complete  by:  As directed    Discharge instructions   Complete by:  As directed    Refer to discharge instructions   Increase activity slowly   Complete by:  As directed       Signed: Mosetta Anis, MD 04/21/2018, 11:19 AM   Pager: [phone]

## 2018-04-21 NOTE — Progress Notes (Signed)
   Subjective: Kimberly Ferguson is a 57 yo F w/ PMH of hyperthyroidism s/p thyroidectomy in 2012 and HTN admit for chest pain r/o.  She had no acute events overnight. She states her chest pain has resolved since receiving nitro,asa,ibuprofen in the ED last night, no recurrence since then. She denies any palpitations, dyspnea, nausea, vomiting. She mentions that her pain is still reproducible with sternal palpation.  Objective:  Vital signs in last 24 hours: Vitals:   04/20/18 2121 04/21/18 0001 04/21/18 0426 04/21/18 0918  BP: 124/73 116/64 (!) 130/92 (!) 156/96  Pulse: 68 81 66 66  Resp: 18 16    Temp: 98.2 F (36.8 C) 98.2 F (36.8 C) 98.1 F (36.7 C) 98.2 F (36.8 C)  TempSrc: Oral Oral Oral Oral  SpO2: 98% 93% 98% 100%  Weight:   82.8 kg   Height:       Physical Exam  Constitutional: She is oriented to person, place, and time and well-developed, well-nourished, and in no distress. No distress.  Eyes: Pupils are equal, round, and reactive to light. Conjunctivae and EOM are normal. No scleral icterus.  Neck: Normal range of motion. Neck supple. No JVD present.  Cardiovascular: Normal rate, regular rhythm, normal heart sounds and intact distal pulses.  Pulmonary/Chest: Effort normal and breath sounds normal. No respiratory distress. She has no wheezes. She exhibits tenderness (tenderness to mid-sternal palpation).  Abdominal: Soft. Bowel sounds are normal. She exhibits no distension. There is no tenderness. There is no guarding.  Musculoskeletal: Normal range of motion. She exhibits no edema.  Neurological: She is alert and oriented to person, place, and time. Gait normal.  Skin: Skin is warm and dry. No rash noted. No erythema.    Assessment/Plan:  Active Problems:   Chest pain  Chest pain most likely 2/2 MSK Kimberly Ferguson w/ PMH of HTN, Hyperthyroidism s/p thyroidectomy on synthroid presenting with acute atypical chest pain. Troponin x3 negative, No ST elevation/depression on  EKG, but show some non-specific T wave changes. Risk factors for heart disease include family hx of MI, HTN, HLD. Cardiology recommend outpatient follow up   - Will discharge today - Ibuprofen 400mg  as needed for chest pain - Appreciate Card recommendations - F/u with cardiology for outpatient work up   Hypertension Continuing to be elevated 130/92 this morning - C/w home meds: nebivol HCI 20mg  daily, Losartan-HCTZ 100-12.5mg  daily  Hypothyroidism TSH at 0.211. Home regimen may need adjustment after discharge - C/w home regimen synthroid 137 mcg  Diet: Regular DVT prophylaxis: Lovenox 40mg  Code: FULL  Dispo: Anticipated discharge today Mosetta Anis, MD 04/21/2018, 10:55 AM Pager: [phone]

## 2018-04-21 NOTE — Progress Notes (Signed)
    Outpatient cardiology follow up arranged for 9/3 with Dr. Shawna Orleans. Apt information placed in chart.    Angelena Form PA-C  [facility]

## 2018-04-21 NOTE — Discharge Instructions (Signed)
Kimberly Ferguson  You came to [place] with complaints of Chest Pain. We did an EKG and followed your blood troponin level to make sure that you were not having a heart attack. We saw that your troponin level was normal but we did seem some non-specific changes in your EKG so the Cardiologists came to see you as well and he recommends that you follow up with him in an outpatient setting. Please be sure to follow up with your heart doctor.  Meanwhile, for your chest pain. We believe it may be an inflammation of your musculoskeletal system of the chest wall. We suggest you take ibuprofen as needed to keep the chest pain down. Please return to the hospital if you experience any significant worsening of your chest pain, shortness of breath, nausea, vomiting or palpitations.  Thank you for choosing [facility]    Chest Wall Pain Chest wall pain is pain in or around the bones and muscles of your chest. Sometimes, an injury causes this pain. Sometimes, the cause may not be known. This pain may take several weeks or longer to get better. Follow these instructions at home: Pay attention to any changes in your symptoms. Take these actions to help with your pain:  Rest as told by your health care provider.  Avoid activities that cause pain. These include any activities that use your chest muscles or your abdominal and side muscles to lift heavy items.  If directed, apply ice to the painful area: ? Put ice in a plastic bag. ? Place a towel between your skin and the bag. ? Leave the ice on for 20 minutes, 2-3 times per day.  Take over-the-counter and prescription medicines only as told by your health care provider.  Do not use tobacco products, including cigarettes, chewing tobacco, and e-cigarettes. If you need help quitting, ask your health care provider.  Keep all follow-up visits as told by your health care provider. This is important.  Contact a health care provider if:  You have a fever.  Your chest  pain becomes worse.  You have new symptoms. Get help right away if:  You have nausea or vomiting.  You feel sweaty or light-headed.  You have a cough with phlegm (sputum) or you cough up blood.  You develop shortness of breath. This information is not intended to replace advice given to you by your health care provider. Make sure you discuss any questions you have with your health care provider. Document Released: 08/22/2005 Document Revised: 12/31/2015 Document Reviewed: 11/17/2014 Elsevier Interactive Patient Education  [employer].

## 2018-05-08 ENCOUNTER — Ambulatory Visit: Payer: No Typology Code available for payment source | Admitting: Cardiology

## 2018-06-15 IMAGING — MG 2D DIGITAL SCREENING BILATERAL MAMMOGRAM WITH CAD AND ADJUNCT TO
8 of 12 series · 8 of 28 positions shown · non-contrast
Comparison: Previous exam(s).

CLINICAL DATA: Screening.

EXAM:
2D DIGITAL SCREENING BILATERAL MAMMOGRAM WITH CAD AND ADJUNCT TOMO

[L MLO synth-2D]
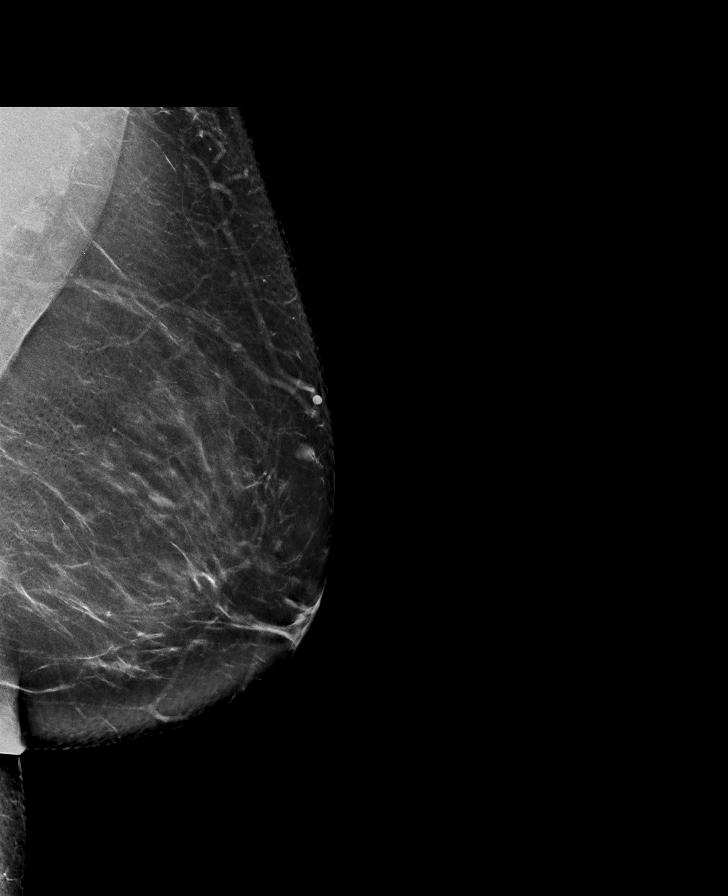

[R CC synth-2D]
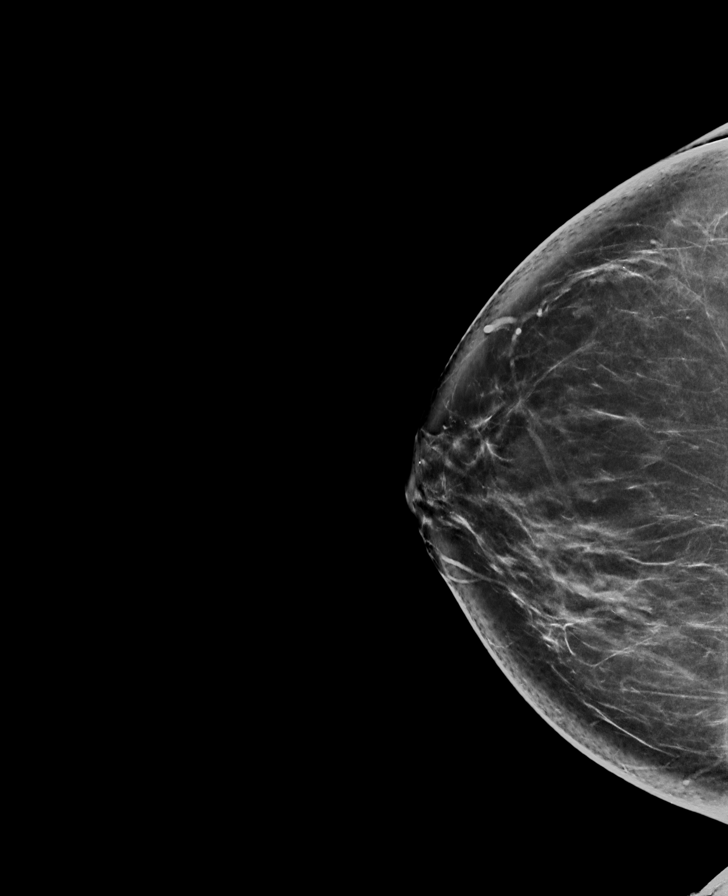

[L CC]
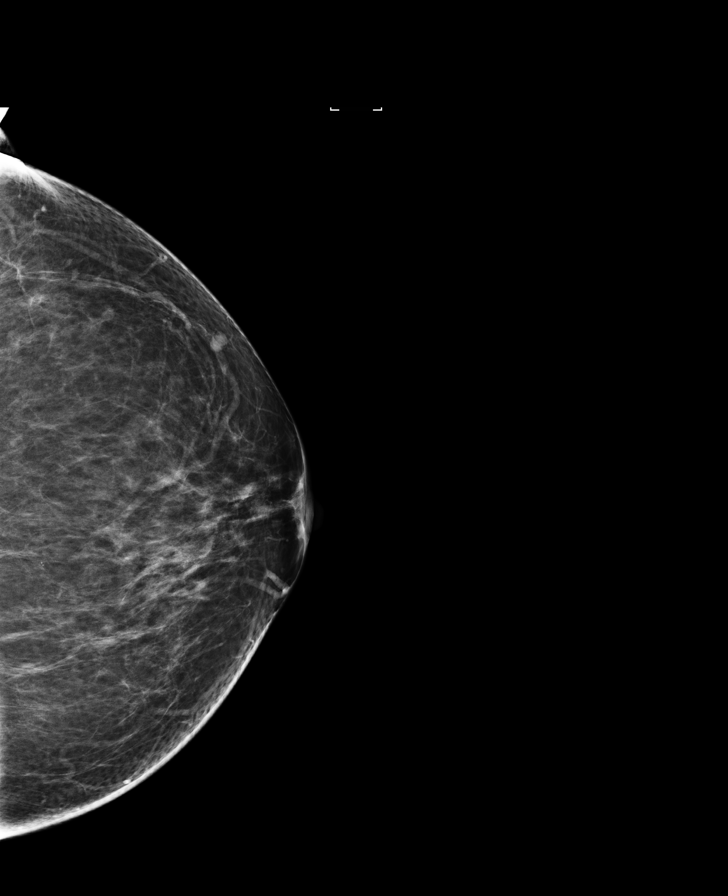

[R MLO]
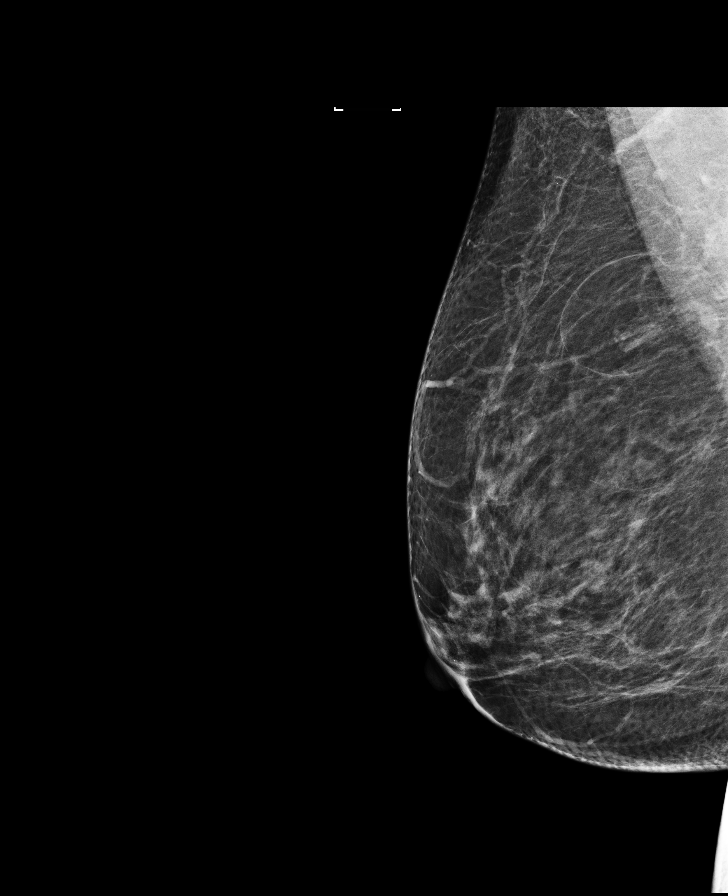

[L CC synth-2D]
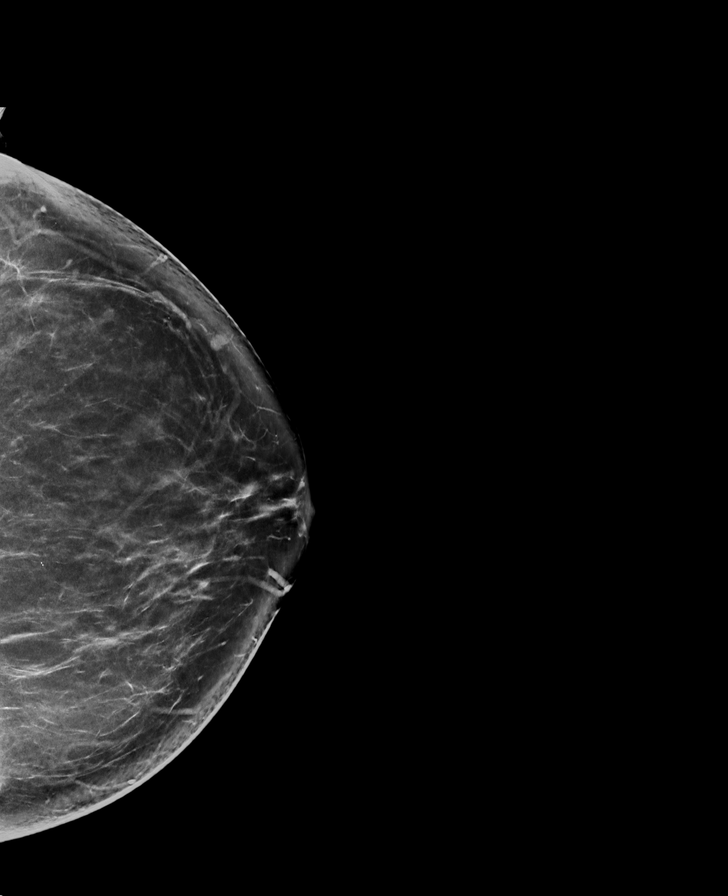

[L MLO]
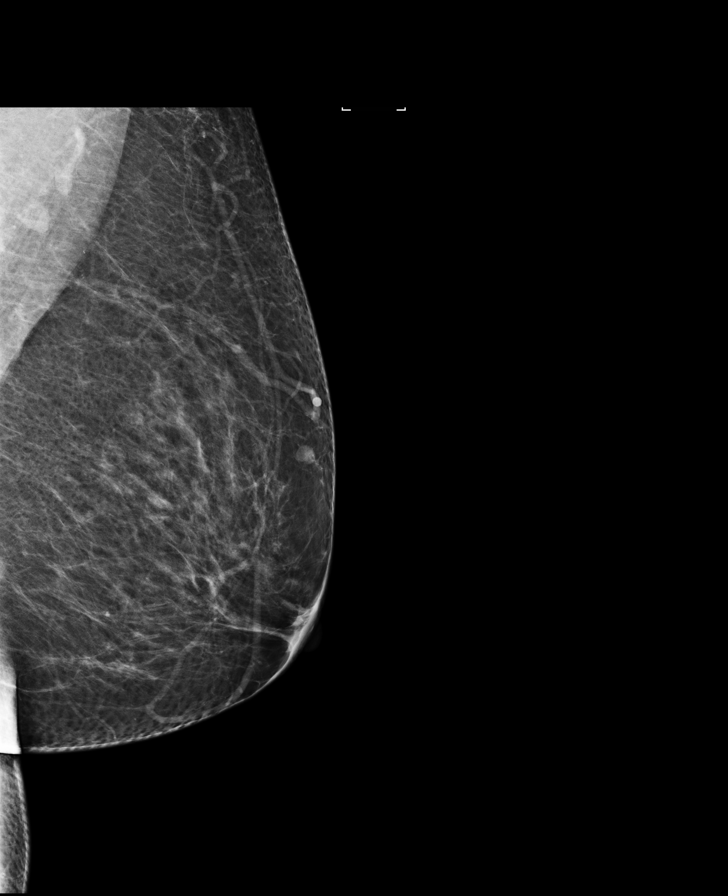

[R MLO synth-2D]
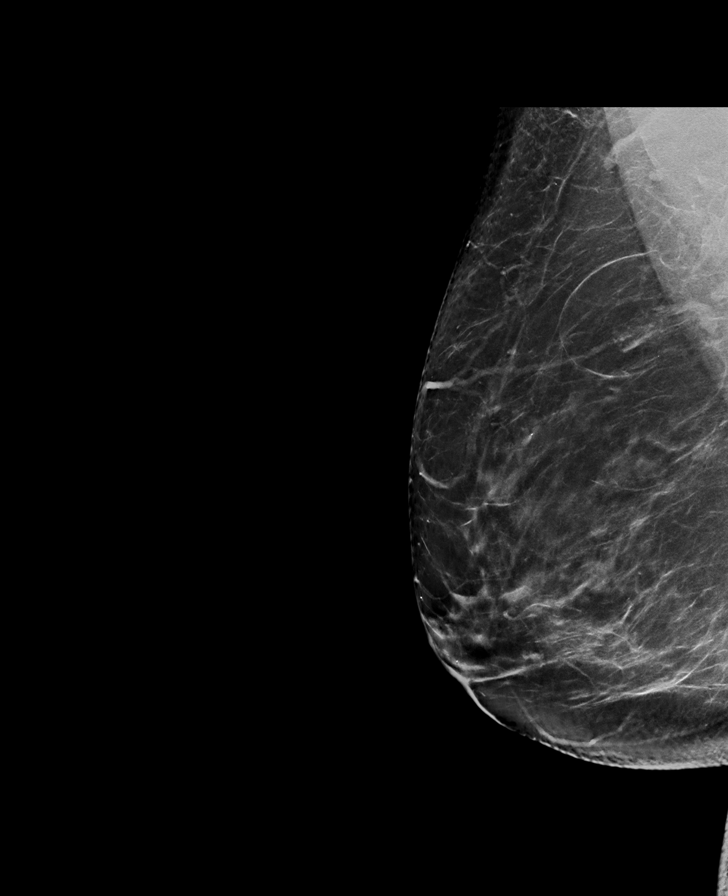

[R CC]
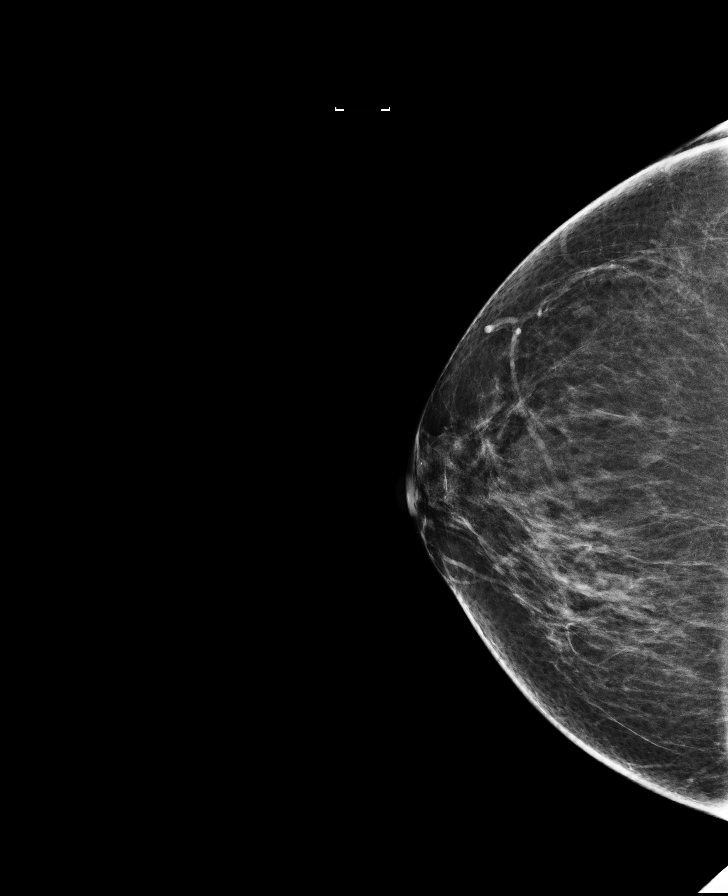

[8 of 28 positions shown; findings below may reference images not displayed]

ACR Breast Density Category b: There are scattered areas of
fibroglandular density.
FINDINGS: There are no findings suspicious for malignancy. Images were
processed with CAD.
IMPRESSION: No mammographic evidence of malignancy. A result letter of this
screening mammogram will be mailed directly to the patient.

RECOMMENDATION:
Screening mammogram in one year. (Code:97-6-RS4)

BI-RADS CATEGORY  1: Negative.

## 2019-01-29 ENCOUNTER — Other Ambulatory Visit: Payer: Self-pay | Admitting: Family Medicine

## 2020-01-04 ENCOUNTER — Emergency Department (HOSPITAL_COMMUNITY)
Admission: EM | Admit: 2020-01-04 | Discharge: 2020-01-04 | Disposition: A | Discharge status: Home / Self Care | Payer: BLUE CROSS/BLUE SHIELD | Attending: Emergency Medicine | Admitting: Emergency Medicine

## 2020-01-04 ENCOUNTER — Emergency Department (HOSPITAL_COMMUNITY): Payer: BLUE CROSS/BLUE SHIELD

## 2020-01-04 ENCOUNTER — Encounter (HOSPITAL_COMMUNITY): Payer: Self-pay | Admitting: Emergency Medicine

## 2020-01-04 DIAGNOSIS — M545 Low back pain: Secondary | ICD-10-CM | POA: Insufficient documentation

## 2020-01-04 DIAGNOSIS — Y92410 Unspecified street and highway as the place of occurrence of the external cause: Secondary | ICD-10-CM | POA: Diagnosis not present

## 2020-01-04 DIAGNOSIS — Z79899 Other long term (current) drug therapy: Secondary | ICD-10-CM | POA: Diagnosis not present

## 2020-01-04 DIAGNOSIS — Y998 Other external cause status: Secondary | ICD-10-CM | POA: Diagnosis not present

## 2020-01-04 DIAGNOSIS — J45909 Unspecified asthma, uncomplicated: Secondary | ICD-10-CM | POA: Diagnosis not present

## 2020-01-04 DIAGNOSIS — Z85850 Personal history of malignant neoplasm of thyroid: Secondary | ICD-10-CM | POA: Diagnosis not present

## 2020-01-04 DIAGNOSIS — M25512 Pain in left shoulder: Secondary | ICD-10-CM | POA: Insufficient documentation

## 2020-01-04 DIAGNOSIS — M25511 Pain in right shoulder: Secondary | ICD-10-CM | POA: Diagnosis not present

## 2020-01-04 DIAGNOSIS — M542 Cervicalgia: Secondary | ICD-10-CM | POA: Diagnosis present

## 2020-01-04 DIAGNOSIS — Z91040 Latex allergy status: Secondary | ICD-10-CM | POA: Diagnosis not present

## 2020-01-04 DIAGNOSIS — H538 Other visual disturbances: Secondary | ICD-10-CM | POA: Diagnosis not present

## 2020-01-04 DIAGNOSIS — R110 Nausea: Secondary | ICD-10-CM | POA: Insufficient documentation

## 2020-01-04 DIAGNOSIS — E039 Hypothyroidism, unspecified: Secondary | ICD-10-CM | POA: Insufficient documentation

## 2020-01-04 DIAGNOSIS — Y9389 Activity, other specified: Secondary | ICD-10-CM | POA: Diagnosis not present

## 2020-01-04 DIAGNOSIS — M7918 Myalgia, other site: Secondary | ICD-10-CM | POA: Diagnosis not present

## 2020-01-04 DIAGNOSIS — I10 Essential (primary) hypertension: Secondary | ICD-10-CM | POA: Diagnosis not present

## 2020-01-04 DIAGNOSIS — Z87891 Personal history of nicotine dependence: Secondary | ICD-10-CM | POA: Insufficient documentation

## 2020-01-04 DIAGNOSIS — R519 Headache, unspecified: Secondary | ICD-10-CM | POA: Insufficient documentation

## 2020-01-04 MED ORDER — ONDANSETRON 4 MG PO TBDP
4.0000 mg | ORAL_TABLET | Freq: Once | ORAL | Status: AC
  Administered 2020-01-04: 12:00:00 4 mg via ORAL
  Filled 2020-01-04: qty 1

## 2020-01-04 MED ORDER — METHOCARBAMOL 750 MG PO TABS: 750.0000 mg | ORAL_TABLET | Freq: Every evening | ORAL | 0 refills | Status: AC | PRN

## 2020-01-04 MED ORDER — IBUPROFEN 200 MG PO TABS
600.0000 mg | ORAL_TABLET | Freq: Once | ORAL | Status: AC
  Administered 2020-01-04: 600 mg via ORAL
  Filled 2020-01-04: qty 3

## 2020-01-04 NOTE — ED Notes (Signed)
Updated pt and daughter, x-ray still pending reading. Radiology following up with [place]
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
## 2020-01-04 NOTE — Discharge Instructions (Addendum)
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
## 2020-01-04 NOTE — ED Notes (Signed)
Headache still about 8/10, nausea has improved some. Pt resting with lights off, visitor at bedside.

## 2020-01-04 NOTE — ED Notes (Signed)
Pt provided with Patient Experience number with Irish Lack name on back. Seems a little calmer, used personal inhaler and meds given as ordered
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
## 2020-02-10 ENCOUNTER — Other Ambulatory Visit: Payer: Self-pay | Admitting: Family Medicine

## 2021-10-17 ENCOUNTER — Encounter (HOSPITAL_COMMUNITY): Payer: Self-pay | Admitting: Emergency Medicine

## 2021-10-17 ENCOUNTER — Emergency Department (HOSPITAL_COMMUNITY)
Admission: EM | Admit: 2021-10-17 | Discharge: 2021-10-18 | Disposition: A | Discharge status: Home / Self Care | Payer: PRIVATE HEALTH INSURANCE | Attending: Emergency Medicine | Admitting: Emergency Medicine

## 2021-10-17 ENCOUNTER — Other Ambulatory Visit: Payer: Self-pay

## 2021-10-17 DIAGNOSIS — R0789 Other chest pain: Secondary | ICD-10-CM | POA: Insufficient documentation

## 2021-10-17 DIAGNOSIS — R0602 Shortness of breath: Secondary | ICD-10-CM | POA: Diagnosis not present

## 2021-10-17 DIAGNOSIS — Z91040 Latex allergy status: Secondary | ICD-10-CM | POA: Diagnosis not present

## 2021-10-17 NOTE — ED Provider Notes (Signed)
[facility] EMERGENCY DEPARTMENT Provider Note   CSN: [id number] Arrival date & time: 10/17/21  2321     History  Chief Complaint  Patient presents with   Chest Pain    Kimberly Ferguson is a 61 y.o. female.  Patient presents to the emergency department for evaluation of chest discomfort.  Patient reports that symptoms began 2 days ago.  Initially it was a slight burning in her chest and she thought it was something that she ate.  Symptoms have progressively worsened over the weekend.  She now feels short of breath and it is more of a pain.  She reports that she laid down tonight to go to sleep and the pain awakened her which prompted her to come to the ER.      Home Medications Prior to Admission medications   Medication Sig Start Date End Date Taking? Authorizing Provider  albuterol (PROVENTIL HFA;VENTOLIN HFA) 108 (90 BASE) MCG/ACT inhaler Inhale 1-2 puffs into the lungs every 6 (six) hours as needed for wheezing or shortness of breath.  01/08/15 04/20/18  [provider]  Cholecalciferol (VITAMIN D) 2000 units CAPS Take 2,000 Units by mouth daily.    [provider]  fluticasone (FLONASE) 50 MCG/ACT nasal spray Place 2 sprays into both nostrils as needed for allergies or rhinitis.    [provider]  ibuprofen (ADVIL,MOTRIN) 200 MG tablet Take 400 mg by mouth as needed for moderate pain.    [provider]  loratadine-pseudoephedrine (CLARITIN-D 24-HOUR) 10-240 MG 24 hr tablet Take 1 tablet by mouth daily as needed for allergies.    [provider]  losartan-hydrochlorothiazide (HYZAAR) 100-12.5 MG per tablet Take 1 tablet by mouth daily. 01/08/15   [provider]  Multiple Vitamin (MULTIVITAMIN WITH MINERALS) TABS tablet Take 1 tablet by mouth daily.    [provider]  Nebivolol HCl 20 MG TABS Take 20 mg by mouth daily. 01/08/15   [provider]  pantoprazole (PROTONIX) 40 MG tablet Take 1 tablet  (40 mg total) by mouth daily. 10/18/21   Orpah Greek, MD  SYNTHROID 137 MCG tablet  05/24/17   [provider]      Allergies    Penicillins, Hydrocodone, Oxycodone, and Latex    Review of Systems   Review of Systems  Respiratory:  Positive for shortness of breath.   Cardiovascular:  Positive for chest pain.   Physical Exam Updated Vital Signs BP 128/78    Pulse 62    Temp 98.6 F (37 C) (Oral)    Resp 18    SpO2 99%  Physical Exam Vitals and nursing note reviewed.  Constitutional:      General: She is not in acute distress.    Appearance: She is well-developed.  HENT:     Head: Normocephalic and atraumatic.     Mouth/Throat:     Mouth: Mucous membranes are moist.  Eyes:     General: Vision grossly intact. Gaze aligned appropriately.     Extraocular Movements: Extraocular movements intact.     Conjunctiva/sclera: Conjunctivae normal.  Cardiovascular:     Rate and Rhythm: Normal rate and regular rhythm.     Pulses: Normal pulses.     Heart sounds: Normal heart sounds, S1 normal and S2 normal. No murmur heard.   No friction rub. No gallop.  Pulmonary:     Effort: Pulmonary effort is normal. No respiratory distress.     Breath sounds: Normal breath sounds.  Abdominal:  General: Bowel sounds are normal.     Palpations: Abdomen is soft.     Tenderness: There is no abdominal tenderness. There is no guarding or rebound.     Hernia: No hernia is present.  Musculoskeletal:        General: No swelling.     Cervical back: Full passive range of motion without pain, normal range of motion and neck supple. No spinous process tenderness or muscular tenderness. Normal range of motion.     Right lower leg: No edema.     Left lower leg: No edema.  Skin:    General: Skin is warm and dry.     Capillary Refill: Capillary refill takes less than 2 seconds.     Findings: No ecchymosis, erythema, rash or wound.  Neurological:     General: No focal deficit present.      Mental Status: She is alert and oriented to person, place, and time.     GCS: GCS eye subscore is 4. GCS verbal subscore is 5. GCS motor subscore is 6.     Cranial Nerves: Cranial nerves 2-12 are intact.     Sensory: Sensation is intact.     Motor: Motor function is intact.     Coordination: Coordination is intact.  Psychiatric:        Attention and Perception: Attention normal.        Mood and Affect: Mood normal.        Speech: Speech normal.        Behavior: Behavior normal.    ED Results / Procedures / Treatments   Labs (all labs ordered are listed, but only abnormal results are displayed) Labs Reviewed  CBC WITH DIFFERENTIAL/PLATELET - Abnormal; Notable for the following components:      Result Value   Neutro Abs 1.4 (*)    All other components within normal limits  COMPREHENSIVE METABOLIC PANEL - Abnormal; Notable for the following components:   Glucose, Bld 108 (*)    All other components within normal limits  LIPASE, BLOOD - Abnormal; Notable for the following components:   Lipase 52 (*)    All other components within normal limits  BRAIN NATRIURETIC PEPTIDE  TROPONIN I (HIGH SENSITIVITY)  TROPONIN I (HIGH SENSITIVITY)    EKG EKG Interpretation  Date/Time:  Sunday October 17 2021 23:28:20 EST Ventricular Rate:  76 PR Interval:  156 QRS Duration: 84 QT Interval:  396 QTC Calculation: 445 R Axis:   12 Text Interpretation: Sinus rhythm with Premature atrial complexes Cannot rule out Anterior infarct , age undetermined Abnormal ECG When compared with ECG of 21-Apr-2018 04:30, Premature atrial complexes are now present Confirmed by Delora Fuel ([id number]) on 10/17/2021 11:32:33 PM  Radiology DG Chest Port 1 View  Result Date: 10/18/2021 CLINICAL DATA:  Chest pain EXAM: PORTABLE CHEST 1 VIEW COMPARISON:  01/04/2020 FINDINGS: Heart and mediastinal contours are within normal limits. No focal opacities or effusions. No acute bony abnormality. IMPRESSION: No active disease.  Electronically Signed   By: Rolm Baptise M.D.   On: 10/18/2021 00:10    Procedures Procedures    Medications Ordered in ED Medications  alum & mag hydroxide-simeth (MAALOX/MYLANTA) 200-200-20 MG/5ML suspension 30 mL (30 mLs Oral Given 10/18/21 0345)    And  lidocaine (XYLOCAINE) 2 % viscous mouth solution 15 mL (15 mLs Oral Given 10/18/21 0345)    ED Course/ Medical Decision Making/ A&P  Medical Decision Making Amount and/or Complexity of Data Reviewed Labs: ordered. Radiology: ordered.  Risk OTC drugs. Prescription drug management.   Presents emergency department for evaluation of chest pain.  Symptoms ongoing for several days.  Patient reports that it seems like a burning sensation that might be related to something she ate.  Symptoms do seem atypical for cardiac etiology.  Cardiac evaluation was negative.  Patient had resolution of symptoms with GI cocktail which is reassuring.  Will discharge with Protonix, follow-up with primary care.  Given return precautions.        Final Clinical Impression(s) / ED Diagnoses Final diagnoses:  Atypical chest pain    Rx / DC Orders ED Discharge Orders          Ordered    pantoprazole (PROTONIX) 40 MG tablet  Daily,   Status:  Discontinued        10/18/21 0432    pantoprazole (PROTONIX) 40 MG tablet  Daily        10/18/21 0448              Orpah Greek, MD 10/19/21 [phone]

## 2021-10-17 NOTE — ED Triage Notes (Signed)
Pt c/o chest burning and shortness of breath since Friday, worsening tonight.

## 2021-10-18 ENCOUNTER — Emergency Department (HOSPITAL_COMMUNITY): Payer: PRIVATE HEALTH INSURANCE

## 2021-10-18 LAB — COMPREHENSIVE METABOLIC PANEL
ALT: 17 U/L (ref 0–44)
AST: 26 U/L (ref 15–41)
Albumin: 3.9 g/dL (ref 3.5–5.0)
Alkaline Phosphatase: 70 U/L (ref 38–126)
Anion gap: 11 (ref 5–15)
BUN: 11 mg/dL (ref 8–23)
CO2: 23 mmol/L (ref 22–32)
Calcium: 9.7 mg/dL (ref 8.9–10.3)
Chloride: 105 mmol/L (ref 98–111)
Creatinine, Ser: 0.93 mg/dL (ref 0.44–1.00)
GFR, Estimated: >60 mL/min (ref >60)
Glucose, Bld: 108 mg/dL — ABNORMAL HIGH (ref 70–99)
Potassium: 3.7 mmol/L (ref 3.5–5.1)
Sodium: 139 mmol/L (ref 135–145)
Total Bilirubin: 0.6 mg/dL (ref 0.3–1.2)
Total Protein: 7.1 g/dL (ref 6.5–8.1)

## 2021-10-18 LAB — CBC WITH DIFFERENTIAL/PLATELET
Abs Immature Granulocytes: 0.02 10*3/uL (ref 0.00–0.07)
Basophils Absolute: 0 10*3/uL (ref 0.0–0.1)
Basophils Relative: 1 %
Eosinophils Absolute: 0.1 10*3/uL (ref 0.0–0.5)
Eosinophils Relative: 2 %
HCT: 42.8 % (ref 36.0–46.0)
Hemoglobin: 14.2 g/dL (ref 12.0–15.0)
Immature Granulocytes: 0 %
Lymphocytes Relative: 56 %
Lymphs Abs: 2.5 10*3/uL (ref 0.7–4.0)
MCH: 28.2 pg (ref 26.0–34.0)
MCHC: 33.2 g/dL (ref 30.0–36.0)
MCV: 85.1 fL (ref 80.0–100.0)
Monocytes Absolute: 0.6 10*3/uL (ref 0.1–1.0)
Monocytes Relative: 12 %
Neutro Abs: 1.4 10*3/uL — ABNORMAL LOW (ref 1.7–7.7)
Neutrophils Relative %: 29 %
Platelets: 246 10*3/uL (ref 150–400)
RBC: 5.03 MIL/uL (ref 3.87–5.11)
RDW: 13.2 % (ref 11.5–15.5)
WBC: 4.6 10*3/uL (ref 4.0–10.5)
nRBC: 0 % (ref 0.0–0.2)

## 2021-10-18 LAB — TROPONIN I (HIGH SENSITIVITY)
Troponin I (High Sensitivity): 4 ng/L (ref <18)
Troponin I (High Sensitivity): 4 ng/L (ref <18)

## 2021-10-18 LAB — BRAIN NATRIURETIC PEPTIDE: B Natriuretic Peptide: 32.1 pg/mL (ref 0.0–100.0)

## 2021-10-18 LAB — LIPASE, BLOOD: Lipase: 52 U/L — ABNORMAL HIGH (ref 11–51)

## 2021-10-18 MED ORDER — PANTOPRAZOLE SODIUM 40 MG PO TBEC: 40.0000 mg | DELAYED_RELEASE_TABLET | Freq: Every day | ORAL | 3 refills | Status: AC

## 2021-10-18 MED ORDER — PANTOPRAZOLE SODIUM 40 MG PO TBEC: 40.0000 mg | DELAYED_RELEASE_TABLET | Freq: Every day | ORAL | 3 refills | Status: DC

## 2021-10-18 MED ORDER — LIDOCAINE VISCOUS HCL 2 % MT SOLN
15.0000 mL | Freq: Once | OROMUCOSAL | Status: AC
  Administered 2021-10-18: 15 mL via ORAL
  Filled 2021-10-18: qty 15

## 2021-10-18 MED ORDER — ALUM & MAG HYDROXIDE-SIMETH 200-200-20 MG/5ML PO SUSP
30.0000 mL | Freq: Once | ORAL | Status: AC
  Administered 2021-10-18: 30 mL via ORAL
  Filled 2021-10-18: qty 30

## 2023-08-11 ENCOUNTER — Other Ambulatory Visit: Payer: Self-pay

## 2023-08-11 ENCOUNTER — Emergency Department (HOSPITAL_COMMUNITY): Payer: PRIVATE HEALTH INSURANCE

## 2023-08-11 ENCOUNTER — Emergency Department (HOSPITAL_COMMUNITY)
Admission: EM | Admit: 2023-08-11 | Discharge: 2023-08-11 | Disposition: A | Discharge status: Home / Self Care | Payer: PRIVATE HEALTH INSURANCE | Attending: Emergency Medicine | Admitting: Emergency Medicine

## 2023-08-11 ENCOUNTER — Encounter (HOSPITAL_COMMUNITY): Payer: Self-pay

## 2023-08-11 DIAGNOSIS — I10 Essential (primary) hypertension: Secondary | ICD-10-CM | POA: Insufficient documentation

## 2023-08-11 DIAGNOSIS — Z85850 Personal history of malignant neoplasm of thyroid: Secondary | ICD-10-CM | POA: Insufficient documentation

## 2023-08-11 DIAGNOSIS — R519 Headache, unspecified: Secondary | ICD-10-CM | POA: Insufficient documentation

## 2023-08-11 DIAGNOSIS — Z79899 Other long term (current) drug therapy: Secondary | ICD-10-CM | POA: Insufficient documentation

## 2023-08-11 DIAGNOSIS — E876 Hypokalemia: Secondary | ICD-10-CM | POA: Insufficient documentation

## 2023-08-11 DIAGNOSIS — Z91040 Latex allergy status: Secondary | ICD-10-CM | POA: Insufficient documentation

## 2023-08-11 DIAGNOSIS — R079 Chest pain, unspecified: Secondary | ICD-10-CM | POA: Insufficient documentation

## 2023-08-11 LAB — BASIC METABOLIC PANEL
Anion gap: 11 (ref 5–15)
BUN: 14 mg/dL (ref 8–23)
CO2: 24 mmol/L (ref 22–32)
Calcium: 9.5 mg/dL (ref 8.9–10.3)
Chloride: 104 mmol/L (ref 98–111)
Creatinine, Ser: 1.07 mg/dL — ABNORMAL HIGH (ref 0.44–1.00)
GFR, Estimated: 59 mL/min — ABNORMAL LOW (ref >60)
Glucose, Bld: 103 mg/dL — ABNORMAL HIGH (ref 70–99)
Potassium: 3.1 mmol/L — ABNORMAL LOW (ref 3.5–5.1)
Sodium: 139 mmol/L (ref 135–145)

## 2023-08-11 LAB — CBC
HCT: 38.7 % (ref 36.0–46.0)
Hemoglobin: 12.8 g/dL (ref 12.0–15.0)
MCH: 28.1 pg (ref 26.0–34.0)
MCHC: 33.1 g/dL (ref 30.0–36.0)
MCV: 85.1 fL (ref 80.0–100.0)
Platelets: 246 10*3/uL (ref 150–400)
RBC: 4.55 MIL/uL (ref 3.87–5.11)
RDW: 13.5 % (ref 11.5–15.5)
WBC: 4.2 10*3/uL (ref 4.0–10.5)
nRBC: 0 % (ref 0.0–0.2)

## 2023-08-11 LAB — TROPONIN I (HIGH SENSITIVITY)
Troponin I (High Sensitivity): 3 ng/L (ref <18)
Troponin I (High Sensitivity): 2 ng/L (ref <18)

## 2023-08-11 MED ORDER — ONDANSETRON 4 MG PO TBDP: 8.0000 mg | ORAL_TABLET | Freq: Once | ORAL | Status: DC

## 2023-08-11 MED ORDER — POTASSIUM CHLORIDE 20 MEQ PO PACK
40.0000 meq | PACK | Freq: Two times a day (BID) | ORAL | Status: DC
  Administered 2023-08-11: 40 meq via ORAL
  Filled 2023-08-11: qty 2

## 2023-08-11 NOTE — ED Notes (Signed)
Dr. Elayne Snare and Carollee Herter RN at bedside with pt.

## 2023-08-11 NOTE — ED Notes (Signed)
Pt provided bag lunch and beverage. 

## 2023-08-11 NOTE — Discharge Instructions (Signed)
You were seen in the emerged from for chest pain and a headache Your EKG labs and chest x-ray did not show any evidence of heart attack here today Your potassium was low and we gave you an oral potassium supplement The rest of your blood work looked okay It is important that you follow-up with your primary care doctor within 1 week for reevaluation Return to the Emergency Department for chest pain trouble breathing severe headaches or any other concerns

## 2023-08-11 NOTE — ED Provider Notes (Signed)
EMERGENCY DEPARTMENT AT [facility] Provider Note   CSN: [id number] Arrival date & time: 08/11/23  0820     History  Chief Complaint  Patient presents with   Chest Pain    Kimberly Ferguson is a 62 y.o. female.  With a history of hypertension and remote history of thyroid cancer who presents to the ED for chest pain.  She has experienced episodic substernal chest pain for the last 2 weeks.  Today she also notes a frontal headache which for started 4 days ago.  1 episode of vomiting earlier today at home.  Has not been evaluated for these issues yet.  No shortness of breath, radiating pain to other regions, fevers, chills, neck pain or significant abdominal pain.  No changes in bowel habits although she does struggle with irritable bowel syndrome.  No drug or alcohol use.   Chest Pain      Home Medications Prior to Admission medications   Medication Sig Start Date End Date Taking? Authorizing Provider  albuterol (PROVENTIL HFA;VENTOLIN HFA) 108 (90 BASE) MCG/ACT inhaler Inhale 1-2 puffs into the lungs every 6 (six) hours as needed for wheezing or shortness of breath.  01/08/15 04/20/18  [provider]  Cholecalciferol (VITAMIN D) 2000 units CAPS Take 2,000 Units by mouth daily.    [provider]  fluticasone (FLONASE) 50 MCG/ACT nasal spray Place 2 sprays into both nostrils as needed for allergies or rhinitis.    [provider]  ibuprofen (ADVIL,MOTRIN) 200 MG tablet Take 400 mg by mouth as needed for moderate pain.    [provider]  loratadine-pseudoephedrine (CLARITIN-D 24-HOUR) 10-240 MG 24 hr tablet Take 1 tablet by mouth daily as needed for allergies.    [provider]  losartan-hydrochlorothiazide (HYZAAR) 100-12.5 MG per tablet Take 1 tablet by mouth daily. 01/08/15   [provider]  Multiple Vitamin (MULTIVITAMIN WITH MINERALS) TABS tablet Take 1 tablet by mouth daily.    [provider]   Nebivolol HCl 20 MG TABS Take 20 mg by mouth daily. 01/08/15   [provider]  pantoprazole (PROTONIX) 40 MG tablet Take 1 tablet (40 mg total) by mouth daily. 10/18/21   Gilda Crease, MD  SYNTHROID 137 MCG tablet  05/24/17   [provider]      Allergies    Penicillins, Hydrocodone, Oxycodone, and Latex    Review of Systems   Review of Systems  Cardiovascular:  Positive for chest pain.    Physical Exam Updated Vital Signs BP (!) 145/88 (BP Location: Right Arm)   Pulse 62   Temp 98.1 F (36.7 C) (Oral)   Resp 14   Ht 5\' 5"  (1.651 m)   Wt 85.3 kg   SpO2 100%   BMI 31.28 kg/m  Physical Exam Vitals and nursing note reviewed.  HENT:     Head: Normocephalic and atraumatic.  Eyes:     Pupils: Pupils are equal, round, and reactive to light.  Cardiovascular:     Rate and Rhythm: Normal rate and regular rhythm.  Pulmonary:     Effort: Pulmonary effort is normal.     Breath sounds: Normal breath sounds.  Abdominal:     Palpations: Abdomen is soft.     Tenderness: There is no abdominal tenderness.  Skin:    General: Skin is warm and dry.  Neurological:     Mental Status: She is alert.  Psychiatric:        Mood and Affect:  Mood normal.     ED Results / Procedures / Treatments   Labs (all labs ordered are listed, but only abnormal results are displayed) Labs Reviewed  BASIC METABOLIC PANEL - Abnormal; Notable for the following components:      Result Value   Potassium 3.1 (*)    Glucose, Bld 103 (*)    Creatinine, Ser 1.07 (*)    GFR, Estimated 59 (*)    All other components within normal limits  CBC  TROPONIN I (HIGH SENSITIVITY)  TROPONIN I (HIGH SENSITIVITY)    EKG EKG Interpretation Date/Time:  Friday August 11 2023 08:28:42 EST Ventricular Rate:  75 PR Interval:  170 QRS Duration:  84 QT Interval:  378 QTC Calculation: 422 R Axis:   -9  Text Interpretation: Sinus rhythm with marked sinus arrhythmia Nonspecific T wave  abnormality Abnormal ECG When compared with ECG of 17-Oct-2021 23:28, PREVIOUS ECG IS PRESENT Confirmed by Estelle June [phone]) on 08/11/2023 2:55:33 PM  Radiology DG Chest 2 View  Result Date: 08/11/2023 CLINICAL DATA:  Chest pain. EXAM: CHEST - 2 VIEW COMPARISON:  10/18/2021. FINDINGS: Low lung volume. Bilateral lung fields are clear. Bilateral costophrenic angles are clear. Normal cardio-mediastinal silhouette. No acute osseous abnormalities. The soft tissues are within normal limits. There are multiple metallic staples overlying the paramedian lower neck, likely from prior thyroid surgery. IMPRESSION: No active cardiopulmonary disease. Electronically Signed   By: Jules Schick M.D.   On: 08/11/2023 09:02    Procedures Procedures    Medications Ordered in ED Medications  ondansetron (ZOFRAN-ODT) disintegrating tablet 8 mg (8 mg Oral Patient Refused/Not Given 08/11/23 1540)  potassium chloride (KLOR-CON) packet 40 mEq (40 mEq Oral Given 08/11/23 1546)    ED Course/ Medical Decision Making/ A&P Clinical Course as of 08/11/23 1627  Fri Aug 11, 2023  1600 Initial EKG without ischemic changes.  High sensitive troponin of less than 2 not consistent with acute coronary syndrome.  CBC unremarkable without evidence of leukocytosis or anemia.  Metabolic panel notable for hypokalemia which the patient has had in the past.  Will provide oral repletion here.  She is hungry and we will let her eat.  Chest x-ray shows no focal consolidation.  As long as she can ambulate with a steady gait feel comfortable discharging with close PCP follow-up [MP]  1625 Patient able to ambulate with a steady gait and feels better after eating something here.  Stable for PCP follow-up.  Unclear cause of her chest pain and headache.  Counseled her on symptomatic management at home return precautions were discussed in detail. [MP]    Clinical Course User Index [MP] Royanne Foots, DO                                  Medical Decision Making 62 year old female with history as above presenting for subacute chest pain as well as headache and vomiting today.  Afebrile and a little hypertensive here otherwise well-appearing.  No active chest pain on my assessment.  Benign physical exam without focal neurodeficits.  Will evaluate for ACS with high sensitive troponin and EKG.  Will obtain laboratory workup to evaluate for leukocytosis, anemia, renal impairment or electrolyte imbalance.  Will obtain chest x-ray to look for evidence of focal consolidation that would be worrisome for pneumonia.  No prior history of dysrhythmia but will continue to monitor on telemetry.  Benign neurologic exam with no focal neurodeficit.  Will medicate with Tylenol and reassess  Amount and/or Complexity of Data Reviewed Labs: ordered. Radiology: ordered.  Risk Prescription drug management.           Final Clinical Impression(s) / ED Diagnoses Final diagnoses:  Chest pain, unspecified type  Hypokalemia  Nonintractable headache, unspecified chronicity pattern, unspecified headache type    Rx / DC Orders ED Discharge Orders     None         Royanne Foots, DO 08/11/23 1627

## 2023-08-11 NOTE — ED Triage Notes (Addendum)
Pt c/o chest pain, dizziness and a headache that started 3-4 days ago. Pt has had vomiting and shortness of breath.

## 2024-03-23 ENCOUNTER — Emergency Department (HOSPITAL_BASED_OUTPATIENT_CLINIC_OR_DEPARTMENT_OTHER)
Admission: EM | Admit: 2024-03-23 | Discharge: 2024-03-23 | Disposition: A | Discharge status: Home / Self Care | Attending: Emergency Medicine | Admitting: Emergency Medicine

## 2024-03-23 ENCOUNTER — Emergency Department (HOSPITAL_BASED_OUTPATIENT_CLINIC_OR_DEPARTMENT_OTHER): Admitting: Radiology

## 2024-03-23 ENCOUNTER — Encounter (HOSPITAL_BASED_OUTPATIENT_CLINIC_OR_DEPARTMENT_OTHER): Payer: Self-pay | Admitting: Emergency Medicine

## 2024-03-23 ENCOUNTER — Other Ambulatory Visit: Payer: Self-pay

## 2024-03-23 DIAGNOSIS — I10 Essential (primary) hypertension: Secondary | ICD-10-CM | POA: Insufficient documentation

## 2024-03-23 DIAGNOSIS — R0789 Other chest pain: Secondary | ICD-10-CM | POA: Insufficient documentation

## 2024-03-23 DIAGNOSIS — Z91040 Latex allergy status: Secondary | ICD-10-CM | POA: Insufficient documentation

## 2024-03-23 DIAGNOSIS — Z79899 Other long term (current) drug therapy: Secondary | ICD-10-CM | POA: Diagnosis not present

## 2024-03-23 DIAGNOSIS — Z85850 Personal history of malignant neoplasm of thyroid: Secondary | ICD-10-CM | POA: Diagnosis not present

## 2024-03-23 DIAGNOSIS — R079 Chest pain, unspecified: Secondary | ICD-10-CM

## 2024-03-23 LAB — BASIC METABOLIC PANEL WITH GFR
Anion gap: 10 (ref 5–15)
BUN: 11 mg/dL (ref 8–23)
CO2: 27 mmol/L (ref 22–32)
Calcium: 10.2 mg/dL (ref 8.9–10.3)
Chloride: 102 mmol/L (ref 98–111)
Creatinine, Ser: 1.02 mg/dL — ABNORMAL HIGH (ref 0.44–1.00)
GFR, Estimated: >60 mL/min (ref >60)
Glucose, Bld: 106 mg/dL — ABNORMAL HIGH (ref 70–99)
Potassium: 3.4 mmol/L — ABNORMAL LOW (ref 3.5–5.1)
Sodium: 140 mmol/L (ref 135–145)

## 2024-03-23 LAB — CBC
HCT: 39.6 % (ref 36.0–46.0)
Hemoglobin: 13.1 g/dL (ref 12.0–15.0)
MCH: 27.8 pg (ref 26.0–34.0)
MCHC: 33.1 g/dL (ref 30.0–36.0)
MCV: 83.9 fL (ref 80.0–100.0)
Platelets: 252 K/uL (ref 150–400)
RBC: 4.72 MIL/uL (ref 3.87–5.11)
RDW: 13.2 % (ref 11.5–15.5)
WBC: 4 K/uL (ref 4.0–10.5)
nRBC: 0 % (ref 0.0–0.2)

## 2024-03-23 LAB — TROPONIN T, HIGH SENSITIVITY: Troponin T High Sensitivity: <15 ng/L (ref <19)

## 2024-03-23 NOTE — Discharge Instructions (Signed)
 You are seen in the emergency department for chest pain for the last few days.  You had blood work EKG and chest x-ray that did not show any evidence of cardiac injury.  I would start with Tylenol  and ibuprofen  for now.  Follow-up with your primary care doctor.  Return to the emergency department if any worsening or concerning symptoms

## 2024-03-23 NOTE — ED Provider Notes (Signed)
 [place] EMERGENCY DEPARTMENT AT [facility] Provider Note   CSN: [id number] Arrival date & time: 03/23/24  0745     Patient presents with: Chest Pain   Kimberly Ferguson is a 63 y.o. female.  She has remote history of thyroid  cancer, has a history of hypertension.  Complaining of of pressure and aching in her lower chest.  Started Thursday on the left and now it has progressed to across her whole lower chest.  Worse with deep breathing but also with bending and movement.  Tried Robaxin  without any improvement.  She does endorse she has been doing a lot of lifting with a newborn grandchild.  Does not feel short of breath no diaphoresis.  No prior history of cardiac disease and is a non-smoker.   The history is provided by the patient.  Chest Pain Pain location:  L chest and R chest Pain quality: aching   Pain radiates to:  Does not radiate Pain severity:  Moderate Onset quality:  Gradual Duration:  3 days Timing:  Intermittent Progression:  Unchanged Chronicity:  New Context: breathing and movement   Relieved by:  Nothing Worsened by:  Movement Ineffective treatments: muscle relaxant. Associated symptoms: no cough, no diaphoresis, no dizziness, no nausea, no shortness of breath and no vomiting   Risk factors: no smoking        Prior to Admission medications   Medication Sig Start Date End Date Taking? Authorizing Provider  albuterol  (PROVENTIL  HFA;VENTOLIN  HFA) 108 (90 BASE) MCG/ACT inhaler Inhale 1-2 puffs into the lungs every 6 (six) hours as needed for wheezing or shortness of breath.  01/08/15 04/20/18  [provider]  Cholecalciferol (VITAMIN D) 2000 units CAPS Take 2,000 Units by mouth daily.    [provider]  fluticasone (FLONASE) 50 MCG/ACT nasal spray Place 2 sprays into both nostrils as needed for allergies or rhinitis.    [provider]  ibuprofen  (ADVIL ,MOTRIN ) 200 MG tablet Take 400 mg by mouth as needed for moderate pain.     [provider]  loratadine-pseudoephedrine (CLARITIN-D 24-HOUR) 10-240 MG 24 hr tablet Take 1 tablet by mouth daily as needed for allergies.    [provider]  losartan -hydrochlorothiazide  (HYZAAR) 100-12.5 MG per tablet Take 1 tablet by mouth daily. 01/08/15   [provider]  Multiple Vitamin (MULTIVITAMIN WITH MINERALS) TABS tablet Take 1 tablet by mouth daily.    [provider]  Nebivolol  HCl 20 MG TABS Take 20 mg by mouth daily. 01/08/15   [provider]  pantoprazole  (PROTONIX ) 40 MG tablet Take 1 tablet (40 mg total) by mouth daily. 10/18/21   Haze Lonni PARAS, MD  SYNTHROID  137 MCG tablet  05/24/17   [provider]    Allergies: Penicillins, Hydrocodone , Oxycodone , and Latex    Review of Systems  Constitutional:  Negative for diaphoresis.  Respiratory:  Negative for cough and shortness of breath.   Cardiovascular:  Positive for chest pain.  Gastrointestinal:  Negative for nausea and vomiting.  Neurological:  Negative for dizziness.    Updated Vital Signs BP 126/80 (BP Location: Right Arm) Comment: Simultaneous filing. User may not have seen previous data.  Pulse 71 Comment: Simultaneous filing. User may not have seen previous data.  Temp 98.5 F (36.9 C) (Oral)   Resp 17 Comment: Simultaneous filing. User may not have seen previous data.  SpO2 98% Comment: Simultaneous filing. User may not have seen previous data.  Physical Exam Vitals and nursing note reviewed.  Constitutional:  General: She is not in acute distress.    Appearance: Normal appearance. She is well-developed.  HENT:     Head: Normocephalic and atraumatic.  Eyes:     Conjunctiva/sclera: Conjunctivae normal.  Cardiovascular:     Rate and Rhythm: Normal rate and regular rhythm.     Heart sounds: No murmur heard. Pulmonary:     Effort: Pulmonary effort is normal. No respiratory distress.     Breath sounds: Normal breath sounds. No stridor. No  wheezing.  Abdominal:     Palpations: Abdomen is soft.     Tenderness: There is no abdominal tenderness. There is no guarding or rebound.  Musculoskeletal:        General: No tenderness.     Cervical back: Neck supple.     Right lower leg: No tenderness. No edema.     Left lower leg: No tenderness. No edema.  Skin:    General: Skin is warm and dry.  Neurological:     General: No focal deficit present.     Mental Status: She is alert.     GCS: GCS eye subscore is 4. GCS verbal subscore is 5. GCS motor subscore is 6.     (all labs ordered are listed, but only abnormal results are displayed) Labs Reviewed  BASIC METABOLIC PANEL WITH GFR - Abnormal; Notable for the following components:      Result Value   Potassium 3.4 (*)    Glucose, Bld 106 (*)    Creatinine, Ser 1.02 (*)    All other components within normal limits  CBC  TROPONIN T, HIGH SENSITIVITY  TROPONIN T, HIGH SENSITIVITY    EKG: EKG Interpretation Date/Time:  Saturday March 23 2024 07:58:39 EDT Ventricular Rate:  73 PR Interval:  166 QRS Duration:  97 QT Interval:  404 QTC Calculation: 446 R Axis:   -1  Text Interpretation: Sinus rhythm Abnormal R-wave progression, early transition Borderline T wave abnormalities No significant change since prior 12/24 Confirmed by Towana Sharper [phone]) on 03/23/2024 8:00:20 AM  Radiology: [facility] Chest 2 View Result Date: 03/23/2024 CLINICAL DATA:  Chest pain EXAM: CHEST - 2 VIEW COMPARISON:  08/11/2023 FINDINGS: Overlapping cardiac leads. The heart size and mediastinal contours are within normal limits. Both lungs are clear. The visualized skeletal structures are unremarkable. IMPRESSION: No active cardiopulmonary disease. Electronically Signed   By: Waddell Calk M.D.   On: 03/23/2024 08:19     Procedures   Medications Ordered in the ED - No data to display  Clinical Course as of 03/23/24 1711  Sat Mar 23, 2024  0923 Patient's workup has been unremarkable.  Do not think  she needs a delta troponin as symptoms been ongoing for a few days.  She is comfortable plan for discharge.  Recommended symptomatic treatment NSAIDs Tylenol . [MB]    Clinical Course User Index [MB] Towana Sharper BROCKS, MD                                 Medical Decision Making Amount and/or Complexity of Data Reviewed Labs: ordered. Radiology: ordered.   This patient complains of chest pain; this involves an extensive number of treatment Options and is a complaint that carries with it a high risk of complications and morbidity. The differential includes ACS, pneumonia, musculoskeletal, reflux, pneumothorax, PE  I ordered, reviewed and interpreted labs, which included CBC normal chemistries mildly low potassium, troponin unremarkable I ordered imaging studies which  included chest x-ray and I independently    visualized and interpreted imaging which showed no acute findings Previous records obtained and reviewed in epic, patient seen last month for chest pain Cardiac monitoring reviewed, sinus rhythm Social determinants considered, no significant barriers Critical Interventions: None  After the interventions stated above, I reevaluated the patient and found patient pain-free at rest although she does endorse pain with movement Admission and further testing considered, no indications for admission at this time.  Recommended symptomatic treatment and follow-up with PCP.  Return instructions discussed      Final diagnoses:  Nonspecific chest pain    ED Discharge Orders     None          Towana Ozell BROCKS, MD 03/23/24 1714

## 2024-03-23 NOTE — ED Triage Notes (Signed)
 She c/o lower chest heaviness x 2 days. She states it began at left lower ribs area and has now extended to bilat. Lower rib areas. She is breathing normally and her skin is normal, warm and dry.
